# Patient Record
Sex: Female | Born: 1955
Health system: Southern US, Community
[De-identification: ages and names within clinical notes are randomized; demographics above are authoritative.]

## PROBLEM LIST (undated history)

## (undated) DIAGNOSIS — I4891 Unspecified atrial fibrillation: Secondary | ICD-10-CM

## (undated) DIAGNOSIS — K219 Gastro-esophageal reflux disease without esophagitis: Secondary | ICD-10-CM

## (undated) DIAGNOSIS — E079 Disorder of thyroid, unspecified: Secondary | ICD-10-CM

## (undated) DIAGNOSIS — L509 Urticaria, unspecified: Secondary | ICD-10-CM

## (undated) DIAGNOSIS — F419 Anxiety disorder, unspecified: Secondary | ICD-10-CM

## (undated) HISTORY — DX: Anxiety disorder, unspecified: F41.9

## (undated) HISTORY — DX: Urticaria, unspecified: L50.9

## (undated) HISTORY — DX: Unspecified atrial fibrillation: I48.91

## (undated) HISTORY — DX: Gastro-esophageal reflux disease without esophagitis: K21.9

## (undated) HISTORY — DX: Disorder of thyroid, unspecified: E07.9

## (undated) HISTORY — PX: TUMOR REMOVAL: SHX12

## (undated) HISTORY — PX: CATARACT EXTRACTION: SUR2

---

## 1998-02-11 ENCOUNTER — Other Ambulatory Visit: Admission: RE | Admit: 1998-02-11 | Discharge: 1998-02-11 | Payer: Self-pay | Admitting: Obstetrics & Gynecology

## 1999-04-22 ENCOUNTER — Other Ambulatory Visit: Admission: RE | Admit: 1999-04-22 | Discharge: 1999-04-22 | Payer: Self-pay | Admitting: Obstetrics & Gynecology

## 2000-07-11 ENCOUNTER — Other Ambulatory Visit: Admission: RE | Admit: 2000-07-11 | Discharge: 2000-07-11 | Payer: Self-pay | Admitting: Obstetrics & Gynecology

## 2001-11-01 ENCOUNTER — Other Ambulatory Visit: Admission: RE | Admit: 2001-11-01 | Discharge: 2001-11-01 | Payer: Self-pay | Admitting: Obstetrics & Gynecology

## 2002-11-14 ENCOUNTER — Other Ambulatory Visit: Admission: RE | Admit: 2002-11-14 | Discharge: 2002-11-14 | Payer: Self-pay | Admitting: Obstetrics & Gynecology

## 2003-11-27 ENCOUNTER — Other Ambulatory Visit: Admission: RE | Admit: 2003-11-27 | Discharge: 2003-11-27 | Payer: Self-pay | Admitting: Obstetrics & Gynecology

## 2004-11-20 ENCOUNTER — Other Ambulatory Visit: Admission: RE | Admit: 2004-11-20 | Discharge: 2004-11-20 | Payer: Self-pay | Admitting: Obstetrics & Gynecology

## 2005-12-08 ENCOUNTER — Other Ambulatory Visit: Admission: RE | Admit: 2005-12-08 | Discharge: 2005-12-08 | Payer: Self-pay | Admitting: Obstetrics & Gynecology

## 2015-07-17 DIAGNOSIS — L501 Idiopathic urticaria: Secondary | ICD-10-CM | POA: Insufficient documentation

## 2015-07-17 DIAGNOSIS — L309 Dermatitis, unspecified: Secondary | ICD-10-CM | POA: Insufficient documentation

## 2015-07-25 ENCOUNTER — Other Ambulatory Visit: Payer: Self-pay

## 2015-07-25 MED ORDER — OMALIZUMAB 150 MG ~~LOC~~ SOLR
300.0000 mg | SUBCUTANEOUS | Status: DC
  Administered 2015-08-14 – 2016-12-27 (×16): 300 mg via SUBCUTANEOUS

## 2015-08-14 ENCOUNTER — Ambulatory Visit (INDEPENDENT_AMBULATORY_CARE_PROVIDER_SITE_OTHER): Payer: Commercial Managed Care - PPO | Admitting: *Deleted

## 2015-08-14 DIAGNOSIS — L501 Idiopathic urticaria: Secondary | ICD-10-CM | POA: Diagnosis not present

## 2015-09-15 ENCOUNTER — Ambulatory Visit (INDEPENDENT_AMBULATORY_CARE_PROVIDER_SITE_OTHER): Payer: Commercial Managed Care - PPO

## 2015-09-15 DIAGNOSIS — L501 Idiopathic urticaria: Secondary | ICD-10-CM | POA: Diagnosis not present

## 2015-10-13 ENCOUNTER — Ambulatory Visit (INDEPENDENT_AMBULATORY_CARE_PROVIDER_SITE_OTHER): Payer: Commercial Managed Care - PPO

## 2015-10-13 DIAGNOSIS — L501 Idiopathic urticaria: Secondary | ICD-10-CM

## 2015-10-14 ENCOUNTER — Other Ambulatory Visit: Payer: Self-pay

## 2015-10-14 MED ORDER — DERMA-SMOOTHE/FS SCALP 0.01 % EX OIL: TOPICAL_OIL | CUTANEOUS | Status: DC

## 2015-10-15 ENCOUNTER — Other Ambulatory Visit: Payer: Self-pay

## 2015-10-15 MED ORDER — DERMA-SMOOTHE/FS SCALP 0.01 % EX OIL: TOPICAL_OIL | CUTANEOUS | Status: DC

## 2015-11-13 ENCOUNTER — Ambulatory Visit (INDEPENDENT_AMBULATORY_CARE_PROVIDER_SITE_OTHER): Payer: Commercial Managed Care - PPO | Admitting: *Deleted

## 2015-11-13 DIAGNOSIS — L501 Idiopathic urticaria: Secondary | ICD-10-CM | POA: Diagnosis not present

## 2015-12-17 ENCOUNTER — Ambulatory Visit (INDEPENDENT_AMBULATORY_CARE_PROVIDER_SITE_OTHER): Payer: Commercial Managed Care - PPO | Admitting: *Deleted

## 2015-12-17 DIAGNOSIS — L501 Idiopathic urticaria: Secondary | ICD-10-CM | POA: Diagnosis not present

## 2016-01-15 ENCOUNTER — Ambulatory Visit (INDEPENDENT_AMBULATORY_CARE_PROVIDER_SITE_OTHER): Payer: Commercial Managed Care - PPO | Admitting: *Deleted

## 2016-01-15 DIAGNOSIS — L501 Idiopathic urticaria: Secondary | ICD-10-CM | POA: Diagnosis not present

## 2016-02-02 ENCOUNTER — Ambulatory Visit (INDEPENDENT_AMBULATORY_CARE_PROVIDER_SITE_OTHER): Payer: Commercial Managed Care - PPO | Admitting: Allergy and Immunology

## 2016-02-02 ENCOUNTER — Encounter: Payer: Self-pay | Admitting: Allergy and Immunology

## 2016-02-02 VITALS — BP 110/70 | HR 64 | Resp 16 | Ht 63.5 in | Wt 113.5 lb

## 2016-02-02 DIAGNOSIS — L308 Other specified dermatitis: Secondary | ICD-10-CM | POA: Diagnosis not present

## 2016-02-02 DIAGNOSIS — L501 Idiopathic urticaria: Secondary | ICD-10-CM

## 2016-02-02 DIAGNOSIS — L989 Disorder of the skin and subcutaneous tissue, unspecified: Secondary | ICD-10-CM

## 2016-02-02 NOTE — Patient Instructions (Signed)
  1. Continue Xolair 300 mg monthly and EpiPen  2. Continue as needed use of over-the-counter antihistamine  3. Continued as needed use of Derma-Smoothe scalp oil one time per day  4. Return to clinic in 1 year or earlier if problem

## 2016-02-02 NOTE — Progress Notes (Signed)
Follow-up Note  Referring Provider: Gordan Raymond, Greg A., MD Primary Provider: Feliciana RossettiGRISSO,GREG, MD Date of Office Visit: 02/02/2016  Subjective:   Heidi JewsCynthia Raymond (DOB: 03/22/1956) is a 60 y.o. female [facility] in re-evaluation of the following:  HPI Comments: Heidi BeechamCynthia returns to this clinic in reevaluation of her idiopathic urticaria treated with Xolair and inflammatory dermatitis affecting her scalp. She is really done quite well since her last visit of August 2016. Towards the tail end of her Xolair administration she will develop a few days of her urticaria that can be treated with an over-the-counter antihistamine. She does not use any other medications on a consistent basis to treat this condition. She uses Derma-Smoothe scalp oil as needed usually around a frequency of once or twice a month which is working quite well to keep her scalp dermatitis under control.   Outpatient Prescriptions Prior to Visit  Medication Sig Dispense Refill  . ALPRAZOLAM PO Take by mouth as needed.    Marland Kitchen. aspirin EC 325 MG tablet Take 325 mg by mouth daily.    . cholecalciferol (VITAMIN D) 1000 UNITS tablet Take 1,000 Units by mouth daily.    . Fluocinolone Acetonide (DERMA-SMOOTHE/FS SCALP) 0.01 % OIL Apply to scalp and leave on overnight as directed 118 mL 2  . MAGNESIUM CARBONATE PO Take by mouth every other day.    . Omega-3 Fatty Acids (FISH OIL EXTRA STRENGTH) 1200 MG CAPS Take by mouth daily.    . cetirizine (ZYRTEC) 10 MG tablet Take 10 mg by mouth daily as needed for allergies. Reported on 02/02/2016    . montelukast (SINGULAIR) 10 MG tablet Take 10 mg by mouth at bedtime. Reported on 02/02/2016    . ranitidine (ZANTAC) 150 MG tablet Take 150 mg by mouth daily. Reported on 02/02/2016     Facility-Administered Medications Prior to Visit  Medication Dose Route Frequency Provider Last Rate Last Dose  . omalizumab Geoffry Paradise(XOLAIR) injection 300 mg  300 mg Subcutaneous  Q14 Days Heidi PriestEric Raymond , MD   300 mg at 01/15/16 1356    No past medical history on file.  Past Surgical History  Procedure Laterality Date  . Tumor removal      behind ear    Allergies  Allergen Reactions  . Nuprin [Ibuprofen] Other (See Comments)    FOREHEAD BREAKS OUT    Review of systems negative except as noted in HPI / PMHx or noted below:  Review of Systems  Constitutional: Negative.   HENT: Negative.   Eyes: Negative.   Respiratory: Negative.   Cardiovascular: Negative.   Gastrointestinal: Negative.   Genitourinary: Negative.   Musculoskeletal: Negative.   Skin: Negative.   Neurological: Negative.   Endo/Heme/Allergies: Negative.   Psychiatric/Behavioral: Negative.      Objective:   Filed Vitals:   02/02/16 1720  BP: 110/70  Pulse: 64  Resp: 16   Height: 5' 3.5" (161.3 cm)  Weight: 113 lb 8.6 oz (51.5 kg)   Physical Exam  Constitutional: She is well-developed, well-nourished, and in no distress.  HENT:  Head: Normocephalic.  Right Ear: Tympanic membrane, external ear and ear canal normal.  Left Ear: Tympanic membrane, external ear and ear canal normal.  Nose: Nose normal. No mucosal edema or rhinorrhea.  Mouth/Throat: Uvula is midline, oropharynx is clear and moist and mucous membranes are normal. No oropharyngeal exudate.  Eyes: Conjunctivae are normal.  Neck: Trachea normal. No tracheal tenderness present. No tracheal deviation present.  No thyromegaly present.  Cardiovascular: Normal rate, regular rhythm, S1 normal, S2 normal and normal heart sounds.   No murmur heard. Pulmonary/Chest: Breath sounds normal. No stridor. No respiratory distress. She has no wheezes. She has no rales.  Musculoskeletal: She exhibits no edema.  Lymphadenopathy:       Head (right side): No tonsillar adenopathy present.       Head (left side): No tonsillar adenopathy present.    She has no cervical adenopathy.    She has no axillary adenopathy.  Neurological: She is  alert. Gait normal.  Skin: No rash noted. She is not diaphoretic. No erythema. Nails show no clubbing.  Psychiatric: Mood and affect normal.    Diagnostics: None    Assessment and Plan:   1. Idiopathic urticaria   2. Inflammatory dermatosis     1. Continue Xolair 300 mg monthly and EpiPen  2. Continue as needed use of over-the-counter antihistamine  3. Continued as needed use of Derma-Smoothe scalp oil one time per day  4. Return to clinic in 1 year or earlier if problem  Heidi Raymond has done very well on her current medical therapy and I see no need for changing this treatment at this point in time. We'll see her back in this clinic in 1 year or earlier if there is a problem.  Heidi Schimke, MD [facility]

## 2016-02-03 ENCOUNTER — Encounter: Payer: Self-pay | Admitting: Allergy and Immunology

## 2016-02-18 ENCOUNTER — Ambulatory Visit (INDEPENDENT_AMBULATORY_CARE_PROVIDER_SITE_OTHER): Payer: Commercial Managed Care - PPO | Admitting: *Deleted

## 2016-02-18 DIAGNOSIS — L501 Idiopathic urticaria: Secondary | ICD-10-CM | POA: Diagnosis not present

## 2016-03-22 ENCOUNTER — Ambulatory Visit (INDEPENDENT_AMBULATORY_CARE_PROVIDER_SITE_OTHER): Payer: Commercial Managed Care - PPO | Admitting: *Deleted

## 2016-03-22 DIAGNOSIS — L501 Idiopathic urticaria: Secondary | ICD-10-CM | POA: Diagnosis not present

## 2016-04-19 ENCOUNTER — Ambulatory Visit (INDEPENDENT_AMBULATORY_CARE_PROVIDER_SITE_OTHER): Payer: Commercial Managed Care - PPO | Admitting: *Deleted

## 2016-04-19 DIAGNOSIS — L501 Idiopathic urticaria: Secondary | ICD-10-CM | POA: Diagnosis not present

## 2016-05-20 ENCOUNTER — Ambulatory Visit (INDEPENDENT_AMBULATORY_CARE_PROVIDER_SITE_OTHER): Payer: Commercial Managed Care - PPO | Admitting: *Deleted

## 2016-05-20 DIAGNOSIS — L501 Idiopathic urticaria: Secondary | ICD-10-CM | POA: Diagnosis not present

## 2016-06-21 ENCOUNTER — Ambulatory Visit (INDEPENDENT_AMBULATORY_CARE_PROVIDER_SITE_OTHER): Payer: Commercial Managed Care - PPO | Admitting: *Deleted

## 2016-06-21 DIAGNOSIS — L501 Idiopathic urticaria: Secondary | ICD-10-CM

## 2016-07-26 ENCOUNTER — Ambulatory Visit (INDEPENDENT_AMBULATORY_CARE_PROVIDER_SITE_OTHER): Payer: Commercial Managed Care - PPO | Admitting: *Deleted

## 2016-07-26 DIAGNOSIS — L501 Idiopathic urticaria: Secondary | ICD-10-CM

## 2016-08-30 ENCOUNTER — Ambulatory Visit (INDEPENDENT_AMBULATORY_CARE_PROVIDER_SITE_OTHER): Payer: Commercial Managed Care - PPO | Admitting: *Deleted

## 2016-08-30 DIAGNOSIS — L501 Idiopathic urticaria: Secondary | ICD-10-CM

## 2016-09-01 ENCOUNTER — Other Ambulatory Visit: Payer: Self-pay | Admitting: Allergy and Immunology

## 2016-09-27 ENCOUNTER — Ambulatory Visit (INDEPENDENT_AMBULATORY_CARE_PROVIDER_SITE_OTHER): Payer: Commercial Managed Care - PPO | Admitting: *Deleted

## 2016-09-27 DIAGNOSIS — L501 Idiopathic urticaria: Secondary | ICD-10-CM | POA: Diagnosis not present

## 2016-10-25 ENCOUNTER — Ambulatory Visit (INDEPENDENT_AMBULATORY_CARE_PROVIDER_SITE_OTHER): Payer: Commercial Managed Care - PPO | Admitting: *Deleted

## 2016-10-25 DIAGNOSIS — L501 Idiopathic urticaria: Secondary | ICD-10-CM

## 2016-11-24 ENCOUNTER — Ambulatory Visit (INDEPENDENT_AMBULATORY_CARE_PROVIDER_SITE_OTHER): Payer: Commercial Managed Care - PPO | Admitting: *Deleted

## 2016-11-24 DIAGNOSIS — L501 Idiopathic urticaria: Secondary | ICD-10-CM

## 2016-12-27 ENCOUNTER — Ambulatory Visit (INDEPENDENT_AMBULATORY_CARE_PROVIDER_SITE_OTHER): Payer: Commercial Managed Care - PPO | Admitting: *Deleted

## 2016-12-27 DIAGNOSIS — L501 Idiopathic urticaria: Secondary | ICD-10-CM

## 2017-01-17 ENCOUNTER — Ambulatory Visit: Payer: Commercial Managed Care - PPO | Admitting: Allergy and Immunology

## 2017-01-24 ENCOUNTER — Ambulatory Visit: Payer: Self-pay | Admitting: Allergy and Immunology

## 2017-02-28 ENCOUNTER — Ambulatory Visit (INDEPENDENT_AMBULATORY_CARE_PROVIDER_SITE_OTHER): Payer: Commercial Managed Care - PPO | Admitting: Allergy and Immunology

## 2017-02-28 ENCOUNTER — Encounter: Payer: Self-pay | Admitting: Allergy and Immunology

## 2017-02-28 VITALS — BP 122/84 | HR 60 | Resp 16

## 2017-02-28 DIAGNOSIS — L501 Idiopathic urticaria: Secondary | ICD-10-CM | POA: Diagnosis not present

## 2017-02-28 DIAGNOSIS — L308 Other specified dermatitis: Secondary | ICD-10-CM

## 2017-02-28 DIAGNOSIS — L989 Disorder of the skin and subcutaneous tissue, unspecified: Secondary | ICD-10-CM

## 2017-02-28 NOTE — Patient Instructions (Signed)
  1. Decrease Xolair 150 mg monthly and EpiPen  2. Continue as needed use of over-the-counter antihistamine  3. Continued as needed use of Derma-Smoothe scalp oil one time per day  4. Return to clinic in 1 year or earlier if problem

## 2017-02-28 NOTE — Progress Notes (Signed)
Follow-up Note  Referring Provider: Gordan Payment., MD Primary Provider: Feliciana Rossetti, MD Date of Office Visit: 02/28/2017  Subjective:   Heidi Raymond (DOB: 31-Dec-1955) is a 61 y.o. female who [facility]2017 in re-evaluation of the following:  HPI: Heidi Raymond returns to this clinic in reevaluation of her idiopathic urticaria and inflammatory dermatitis affecting her scalp. I have not seen her in this clinic in 1 year.  While utilizing omalizumab 300 mg once a month she has had complete control of her urticaria. She no longer uses any over-the-counter antihistamine. Rarely does she have any problems with the inflammatory condition of her scalp. She rarely uses any Derma-Smoothe scalp oil at this point.  Allergies as of 02/28/2017      Reactions   Nuprin [ibuprofen] Other (See Comments)   FOREHEAD BREAKS OUT      Medication List      ALPRAZOLAM PO Take by mouth as needed.   aspirin EC 325 MG tablet Take 325 mg by mouth daily.   cetirizine 10 MG tablet Commonly known as:  ZYRTEC Take 10 mg by mouth daily as needed for allergies. Reported on 02/02/2016   cholecalciferol 1000 units tablet Commonly known as:  VITAMIN D Take 1,000 Units by mouth daily.   DERMA-SMOOTHE/FS SCALP 0.01 % Oil Generic drug:  Fluocinolone Acetonide Scalp Apply to scalp and leave on overnight as directed   FISH OIL EXTRA STRENGTH 1200 MG Caps Take by mouth daily.   Lutein 10 MG Tabs Take 10 mg by mouth daily.   MAGNESIUM CARBONATE PO Take by mouth every other day.   vitamin C 500 MG tablet Commonly known as:  ASCORBIC ACID Take 500 mg by mouth daily.   XOLAIR 150 MG injection Generic drug:  omalizumab MIX AND INJECT 300 MG UNDER THE SKIN EVERY 4 WEEKS       History reviewed. No pertinent past medical history.  Past Surgical History:  Procedure Laterality Date  . TUMOR REMOVAL     behind ear    Review of systems negative except as noted  in HPI / PMHx or noted below:  Review of Systems  Constitutional: Negative.   HENT: Negative.   Eyes: Negative.   Respiratory: Negative.   Cardiovascular: Negative.   Gastrointestinal: Negative.   Genitourinary: Negative.   Musculoskeletal: Negative.   Skin: Negative.   Neurological: Negative.   Endo/Heme/Allergies: Negative.   Psychiatric/Behavioral: Negative.      Objective:   Vitals:   02/28/17 1727  BP: 122/84  Pulse: 60  Resp: 16          Physical Exam  Constitutional: She is well-developed, well-nourished, and in no distress.  HENT:  Head: Normocephalic.  Right Ear: Tympanic membrane, external ear and ear canal normal.  Left Ear: Tympanic membrane, external ear and ear canal normal.  Nose: Nose normal. No mucosal edema or rhinorrhea.  Mouth/Throat: Uvula is midline, oropharynx is clear and moist and mucous membranes are normal. No oropharyngeal exudate.  Eyes: Conjunctivae are normal.  Neck: Trachea normal. No tracheal tenderness present. No tracheal deviation present. No thyromegaly present.  Cardiovascular: Normal rate, regular rhythm, S1 normal, S2 normal and normal heart sounds.   No murmur heard. Pulmonary/Chest: Breath sounds normal. No stridor. No respiratory distress. She has no wheezes. She has no rales.  Musculoskeletal: She exhibits no edema.  Lymphadenopathy:       Head (right side): No tonsillar adenopathy present.  Head (left side): No tonsillar adenopathy present.    She has no cervical adenopathy.  Neurological: She is alert. Gait normal.  Skin: No rash noted. She is not diaphoretic. No erythema. Nails show no clubbing.  Psychiatric: Mood and affect normal.    Diagnostics: none   Assessment and Plan:   1. Idiopathic urticaria   2. Inflammatory dermatosis     1. Decrease Xolair 150 mg monthly and EpiPen  2. Continue as needed use of over-the-counter antihistamine  3. Continued as needed use of Derma-Smoothe scalp oil one time  per day  4. Return to clinic in 1 year or earlier if problem  We will now see if we can lower the dose of omalizumab that is required to control [name]'s immunologic hyperreactivity manifested as chronic urticaria and apparent inflammatory dermatosis involving her scalp. She will keep in contact with me noting her response to this approach. If she does well I will see her in this clinic in one year or earlier if there is a problem.  [name], MD Allergy / Immunology [facility]

## 2017-03-02 ENCOUNTER — Other Ambulatory Visit: Payer: Self-pay | Admitting: *Deleted

## 2017-03-02 MED ORDER — OMALIZUMAB 150 MG ~~LOC~~ SOLR
150.0000 mg | SUBCUTANEOUS | Status: DC
  Administered 2017-03-07 – 2018-02-09 (×10): 150 mg via SUBCUTANEOUS

## 2017-03-07 ENCOUNTER — Ambulatory Visit (INDEPENDENT_AMBULATORY_CARE_PROVIDER_SITE_OTHER): Payer: Commercial Managed Care - PPO | Admitting: *Deleted

## 2017-03-07 DIAGNOSIS — L501 Idiopathic urticaria: Secondary | ICD-10-CM

## 2017-03-09 ENCOUNTER — Other Ambulatory Visit: Payer: Self-pay | Admitting: Obstetrics & Gynecology

## 2017-03-09 DIAGNOSIS — E2839 Other primary ovarian failure: Secondary | ICD-10-CM

## 2017-03-23 ENCOUNTER — Ambulatory Visit
Admission: RE | Admit: 2017-03-23 | Discharge: 2017-03-23 | Disposition: A | Discharge status: Home / Self Care | Payer: Commercial Managed Care - PPO | Source: Ambulatory Visit | Attending: Obstetrics & Gynecology | Admitting: Obstetrics & Gynecology

## 2017-03-23 DIAGNOSIS — E2839 Other primary ovarian failure: Secondary | ICD-10-CM

## 2017-04-18 ENCOUNTER — Ambulatory Visit (INDEPENDENT_AMBULATORY_CARE_PROVIDER_SITE_OTHER): Payer: Commercial Managed Care - PPO | Admitting: *Deleted

## 2017-04-18 DIAGNOSIS — L501 Idiopathic urticaria: Secondary | ICD-10-CM | POA: Diagnosis not present

## 2017-04-22 ENCOUNTER — Telehealth: Payer: Self-pay | Admitting: *Deleted

## 2017-04-22 NOTE — Telephone Encounter (Signed)
Patient needs a call back about her account

## 2017-04-22 NOTE — Telephone Encounter (Signed)
Pt has an NSF check on her account but she called her bank & they say they never rec'd that check from us - emailed Amy J to help with this, but have not heard from her yet - left patient a message that I am off Mon-Wed and will get back with her on Thursday.

## 2017-04-28 NOTE — Telephone Encounter (Signed)
Called pt back today but she is out until Monday - left message to call me back - kt

## 2017-04-28 NOTE — Telephone Encounter (Signed)
Pt called me back - gave me add'l account number - emailed Orlena SheldonKeyonna & Amy to see if these numbers would help - kt

## 2017-04-29 NOTE — Telephone Encounter (Signed)
Called pt - we scanned her check improperly so all the numbers did not show up & bank was not able to process - I have adjusted the NSF fee - she will send another check - kt

## 2017-05-16 ENCOUNTER — Ambulatory Visit (INDEPENDENT_AMBULATORY_CARE_PROVIDER_SITE_OTHER): Payer: Commercial Managed Care - PPO | Admitting: *Deleted

## 2017-05-16 DIAGNOSIS — L501 Idiopathic urticaria: Secondary | ICD-10-CM | POA: Diagnosis not present

## 2017-06-20 ENCOUNTER — Ambulatory Visit (INDEPENDENT_AMBULATORY_CARE_PROVIDER_SITE_OTHER): Payer: Commercial Managed Care - PPO | Admitting: *Deleted

## 2017-06-20 DIAGNOSIS — L501 Idiopathic urticaria: Secondary | ICD-10-CM | POA: Diagnosis not present

## 2017-07-25 ENCOUNTER — Ambulatory Visit (INDEPENDENT_AMBULATORY_CARE_PROVIDER_SITE_OTHER): Payer: Commercial Managed Care - PPO | Admitting: *Deleted

## 2017-07-25 DIAGNOSIS — L501 Idiopathic urticaria: Secondary | ICD-10-CM

## 2017-08-01 ENCOUNTER — Other Ambulatory Visit: Payer: Self-pay | Admitting: Allergy and Immunology

## 2017-09-19 ENCOUNTER — Ambulatory Visit (INDEPENDENT_AMBULATORY_CARE_PROVIDER_SITE_OTHER): Payer: Commercial Managed Care - PPO | Admitting: *Deleted

## 2017-09-19 DIAGNOSIS — L501 Idiopathic urticaria: Secondary | ICD-10-CM

## 2017-11-10 ENCOUNTER — Ambulatory Visit (INDEPENDENT_AMBULATORY_CARE_PROVIDER_SITE_OTHER): Payer: Commercial Managed Care - PPO | Admitting: *Deleted

## 2017-11-10 DIAGNOSIS — L501 Idiopathic urticaria: Secondary | ICD-10-CM

## 2017-12-15 ENCOUNTER — Ambulatory Visit (INDEPENDENT_AMBULATORY_CARE_PROVIDER_SITE_OTHER): Payer: Commercial Managed Care - PPO | Admitting: *Deleted

## 2017-12-15 DIAGNOSIS — L501 Idiopathic urticaria: Secondary | ICD-10-CM | POA: Diagnosis not present

## 2018-01-12 ENCOUNTER — Ambulatory Visit (INDEPENDENT_AMBULATORY_CARE_PROVIDER_SITE_OTHER): Payer: Commercial Managed Care - PPO | Admitting: *Deleted

## 2018-01-12 DIAGNOSIS — L501 Idiopathic urticaria: Secondary | ICD-10-CM | POA: Diagnosis not present

## 2018-02-09 ENCOUNTER — Ambulatory Visit (INDEPENDENT_AMBULATORY_CARE_PROVIDER_SITE_OTHER): Payer: Commercial Managed Care - PPO | Admitting: *Deleted

## 2018-02-09 DIAGNOSIS — L501 Idiopathic urticaria: Secondary | ICD-10-CM

## 2018-02-27 ENCOUNTER — Encounter: Payer: Self-pay | Admitting: Allergy and Immunology

## 2018-02-27 ENCOUNTER — Ambulatory Visit: Payer: Commercial Managed Care - PPO | Admitting: Allergy and Immunology

## 2018-02-27 VITALS — BP 126/82 | HR 64 | Resp 16

## 2018-02-27 DIAGNOSIS — L501 Idiopathic urticaria: Secondary | ICD-10-CM | POA: Diagnosis not present

## 2018-02-27 DIAGNOSIS — L989 Disorder of the skin and subcutaneous tissue, unspecified: Secondary | ICD-10-CM

## 2018-02-27 DIAGNOSIS — L308 Other specified dermatitis: Secondary | ICD-10-CM

## 2018-02-27 MED ORDER — TRIAMCINOLONE ACETONIDE 0.1 % EX CREA: TOPICAL_CREAM | CUTANEOUS | 5 refills | Status: DC

## 2018-02-27 NOTE — Progress Notes (Signed)
Follow-up Note  Referring Provider: Gordan Payment., MD Primary Provider: Gordan Payment., MD Date of Office Visit: 02/27/2018  Subjective:   Heidi Raymond (DOB: 1956-09-13) is a 62 y.o. female who [facility]/2018 in re-evaluation of the following:  HPI: Heidi Raymond returns to this clinic in reevaluation of her idiopathic urticaria and inflammatory dermatosis involving predominantly scalp.  I have not seen her in this clinic since 28 February 2017.  At that point in time we decreased her omalizumab dose from 300 mg monthly to 150 mg monthly.  She did very well with her omalizumab injections and rarely used any over-the-counter antihistamine and all of the itchiness involving her scalp had resolved and she had no need to use Derma-Smoothe scalp oil.  However, for the past 3 months or so she has developed recurrent red patches that are intensely itchy involving her posterior neck and her left back.  She has been applying an old prescription of Kenalog cream which may help somewhat.  There has not really been a significant environmental change in her life over the course of the past 3-6 months and there is not been any associated systemic or constitutional symptoms that may account for her change in immunological activity.  Allergies as of 02/27/2018      Reactions   Nuprin [ibuprofen] Other (See Comments)   FOREHEAD BREAKS OUT      Medication List      ALPRAZOLAM PO Take by mouth as needed.   aspirin EC 325 MG tablet Take 325 mg by mouth daily.   cholecalciferol 1000 units tablet Commonly known as:  VITAMIN D Take 1,000 Units by mouth daily.   DERMA-SMOOTHE/FS SCALP 0.01 % Oil Generic drug:  Fluocinolone Acetonide Scalp Apply to scalp and leave on overnight as directed   FISH OIL EXTRA STRENGTH 1200 MG Caps Take by mouth daily.   Lutein 10 MG Tabs Take 10 mg by mouth daily.   MAGNESIUM CARBONATE PO Take by mouth every other day.     vitamin C 500 MG tablet Commonly known as:  ASCORBIC ACID Take 500 mg by mouth daily.   XOLAIR 150 MG injection Generic drug:  omalizumab Inject 150 mg into the skin every 28 (twenty-eight) days. New updated dosage       Past Medical History:  Diagnosis Date  . Urticaria     Past Surgical History:  Procedure Laterality Date  . TUMOR REMOVAL     behind ear    Review of systems negative except as noted in HPI / PMHx or noted below:  Review of Systems  Constitutional: Negative.   HENT: Negative.   Eyes: Negative.   Respiratory: Negative.   Cardiovascular: Negative.   Gastrointestinal: Negative.   Genitourinary: Negative.   Musculoskeletal: Negative.   Skin: Negative.   Neurological: Negative.   Endo/Heme/Allergies: Negative.   Psychiatric/Behavioral: Negative.      Objective:   Vitals:   02/27/18 1730  BP: 126/82  Pulse: 64  Resp: 16          Physical Exam  HENT:  Head: Normocephalic.  Right Ear: Tympanic membrane, external ear and ear canal normal.  Left Ear: Tympanic membrane, external ear and ear canal normal.  Nose: Nose normal. No mucosal edema or rhinorrhea.  Mouth/Throat: Uvula is midline, oropharynx is clear and moist and mucous membranes are normal. No oropharyngeal exudate.  Eyes: Conjunctivae are normal.  Neck: Trachea normal. No tracheal tenderness present. No  tracheal deviation present. No thyromegaly present.  Cardiovascular: Normal rate, regular rhythm, S1 normal, S2 normal and normal heart sounds.  No murmur heard. Pulmonary/Chest: Breath sounds normal. No stridor. No respiratory distress. She has no wheezes. She has no rales.  Musculoskeletal: She exhibits no edema.  Lymphadenopathy:       Head (right side): No tonsillar adenopathy present.       Head (left side): No tonsillar adenopathy present.    She has no cervical adenopathy.  Neurological: She is alert.  Skin: Rash (Erythematous indurated blanching lesion left upper back)  noted. She is not diaphoretic. No erythema. Nails show no clubbing.    Diagnostics: none  Assessment and Plan:   1. Idiopathic urticaria   2. Inflammatory dermatosis     1. Increase Xolair 300 mg monthly and EpiPen  2. Continue as needed use of over-the-counter antihistamine  3. Continued as needed use of Derma-Smoothe scalp oil one time per day  4.  Can use triamcinolone 0.1% cream applied 1-2 times per day if needed  5. Return to clinic in 1 year or earlier if problem  Heidi Raymond has had a little bit more immunological hyperreactivity lately and we will increase her omalizumab back up to 300 mg and she has the option of utilizing other forms of therapy directed against an inflammatory dermatosis.  I will see her back in this clinic pending her response.  If she does well I will see her back in this clinic in 1 year or earlier if there is a problem.  Laurette SchimkeEric Kozlow, MD Allergy / Immunology [facility]

## 2018-02-27 NOTE — Patient Instructions (Addendum)
  1. Increase Xolair 300 mg monthly and EpiPen  2. Continue as needed use of over-the-counter antihistamine  3. Continued as needed use of Derma-Smoothe scalp oil one time per day  4.  Can use triamcinolone 0.1% cream applied 1-2 times per day if needed  5. Return to clinic in 1 year or earlier if problem

## 2018-02-28 ENCOUNTER — Encounter: Payer: Self-pay | Admitting: Allergy and Immunology

## 2018-03-09 ENCOUNTER — Ambulatory Visit: Payer: Self-pay

## 2018-03-13 ENCOUNTER — Ambulatory Visit (INDEPENDENT_AMBULATORY_CARE_PROVIDER_SITE_OTHER): Payer: Commercial Managed Care - PPO | Admitting: *Deleted

## 2018-03-13 DIAGNOSIS — L501 Idiopathic urticaria: Secondary | ICD-10-CM | POA: Diagnosis not present

## 2018-03-13 MED ORDER — OMALIZUMAB 150 MG ~~LOC~~ SOLR
300.0000 mg | SUBCUTANEOUS | Status: DC
  Administered 2018-03-13 – 2020-12-08 (×35): 300 mg via SUBCUTANEOUS

## 2018-03-27 ENCOUNTER — Other Ambulatory Visit: Payer: Self-pay | Admitting: *Deleted

## 2018-03-27 ENCOUNTER — Ambulatory Visit: Payer: Self-pay | Admitting: *Deleted

## 2018-03-27 MED ORDER — XOLAIR 150 MG ~~LOC~~ SOLR: 300.0000 mg | SUBCUTANEOUS | 11 refills | Status: DC

## 2018-03-28 DIAGNOSIS — Z01419 Encounter for gynecological examination (general) (routine) without abnormal findings: Secondary | ICD-10-CM | POA: Diagnosis not present

## 2018-03-28 DIAGNOSIS — Z1231 Encounter for screening mammogram for malignant neoplasm of breast: Secondary | ICD-10-CM | POA: Diagnosis not present

## 2018-03-28 DIAGNOSIS — Z0000 Encounter for general adult medical examination without abnormal findings: Secondary | ICD-10-CM | POA: Diagnosis not present

## 2018-03-28 DIAGNOSIS — Z124 Encounter for screening for malignant neoplasm of cervix: Secondary | ICD-10-CM | POA: Diagnosis not present

## 2018-04-04 DIAGNOSIS — R002 Palpitations: Secondary | ICD-10-CM | POA: Diagnosis not present

## 2018-04-04 DIAGNOSIS — E039 Hypothyroidism, unspecified: Secondary | ICD-10-CM | POA: Diagnosis not present

## 2018-04-05 DIAGNOSIS — R001 Bradycardia, unspecified: Secondary | ICD-10-CM | POA: Diagnosis not present

## 2018-04-11 ENCOUNTER — Ambulatory Visit: Payer: Self-pay

## 2018-04-12 ENCOUNTER — Ambulatory Visit (INDEPENDENT_AMBULATORY_CARE_PROVIDER_SITE_OTHER): Payer: Commercial Managed Care - PPO | Admitting: *Deleted

## 2018-04-12 ENCOUNTER — Ambulatory Visit: Payer: Self-pay

## 2018-04-12 DIAGNOSIS — L501 Idiopathic urticaria: Secondary | ICD-10-CM | POA: Diagnosis not present

## 2018-04-13 ENCOUNTER — Ambulatory Visit: Payer: Self-pay

## 2018-04-25 DIAGNOSIS — E039 Hypothyroidism, unspecified: Secondary | ICD-10-CM | POA: Diagnosis not present

## 2018-05-10 ENCOUNTER — Ambulatory Visit: Payer: Self-pay

## 2018-05-15 ENCOUNTER — Ambulatory Visit (INDEPENDENT_AMBULATORY_CARE_PROVIDER_SITE_OTHER): Payer: Commercial Managed Care - PPO | Admitting: *Deleted

## 2018-05-15 DIAGNOSIS — L501 Idiopathic urticaria: Secondary | ICD-10-CM

## 2018-06-12 ENCOUNTER — Ambulatory Visit (INDEPENDENT_AMBULATORY_CARE_PROVIDER_SITE_OTHER): Payer: Commercial Managed Care - PPO | Admitting: *Deleted

## 2018-06-12 DIAGNOSIS — L501 Idiopathic urticaria: Secondary | ICD-10-CM

## 2018-07-10 ENCOUNTER — Ambulatory Visit (INDEPENDENT_AMBULATORY_CARE_PROVIDER_SITE_OTHER): Payer: Commercial Managed Care - PPO | Admitting: *Deleted

## 2018-07-10 DIAGNOSIS — L501 Idiopathic urticaria: Secondary | ICD-10-CM | POA: Diagnosis not present

## 2018-08-07 ENCOUNTER — Ambulatory Visit (INDEPENDENT_AMBULATORY_CARE_PROVIDER_SITE_OTHER): Payer: Commercial Managed Care - PPO | Admitting: *Deleted

## 2018-08-07 DIAGNOSIS — L501 Idiopathic urticaria: Secondary | ICD-10-CM | POA: Diagnosis not present

## 2018-09-04 ENCOUNTER — Ambulatory Visit (INDEPENDENT_AMBULATORY_CARE_PROVIDER_SITE_OTHER): Payer: Commercial Managed Care - PPO | Admitting: *Deleted

## 2018-09-04 DIAGNOSIS — L501 Idiopathic urticaria: Secondary | ICD-10-CM

## 2018-10-02 ENCOUNTER — Ambulatory Visit (INDEPENDENT_AMBULATORY_CARE_PROVIDER_SITE_OTHER): Payer: Commercial Managed Care - PPO | Admitting: *Deleted

## 2018-10-02 DIAGNOSIS — L501 Idiopathic urticaria: Secondary | ICD-10-CM

## 2018-10-30 ENCOUNTER — Ambulatory Visit (INDEPENDENT_AMBULATORY_CARE_PROVIDER_SITE_OTHER): Payer: Commercial Managed Care - PPO | Admitting: *Deleted

## 2018-10-30 DIAGNOSIS — L501 Idiopathic urticaria: Secondary | ICD-10-CM | POA: Diagnosis not present

## 2018-11-27 ENCOUNTER — Ambulatory Visit (INDEPENDENT_AMBULATORY_CARE_PROVIDER_SITE_OTHER): Payer: Commercial Managed Care - PPO | Admitting: *Deleted

## 2018-11-27 DIAGNOSIS — L501 Idiopathic urticaria: Secondary | ICD-10-CM

## 2018-12-25 ENCOUNTER — Ambulatory Visit: Payer: Self-pay

## 2018-12-27 DIAGNOSIS — J040 Acute laryngitis: Secondary | ICD-10-CM | POA: Diagnosis not present

## 2018-12-27 DIAGNOSIS — J309 Allergic rhinitis, unspecified: Secondary | ICD-10-CM | POA: Diagnosis not present

## 2019-01-01 ENCOUNTER — Ambulatory Visit (INDEPENDENT_AMBULATORY_CARE_PROVIDER_SITE_OTHER): Payer: Commercial Managed Care - PPO | Admitting: *Deleted

## 2019-01-01 DIAGNOSIS — L501 Idiopathic urticaria: Secondary | ICD-10-CM | POA: Diagnosis not present

## 2019-01-29 ENCOUNTER — Ambulatory Visit (INDEPENDENT_AMBULATORY_CARE_PROVIDER_SITE_OTHER): Payer: Commercial Managed Care - PPO | Admitting: *Deleted

## 2019-01-29 ENCOUNTER — Other Ambulatory Visit: Payer: Self-pay

## 2019-01-29 DIAGNOSIS — L501 Idiopathic urticaria: Secondary | ICD-10-CM | POA: Diagnosis not present

## 2019-02-26 ENCOUNTER — Ambulatory Visit (INDEPENDENT_AMBULATORY_CARE_PROVIDER_SITE_OTHER): Payer: Commercial Managed Care - PPO | Admitting: *Deleted

## 2019-02-26 ENCOUNTER — Other Ambulatory Visit: Payer: Self-pay

## 2019-02-26 DIAGNOSIS — L501 Idiopathic urticaria: Secondary | ICD-10-CM | POA: Diagnosis not present

## 2019-02-28 ENCOUNTER — Other Ambulatory Visit: Payer: Self-pay | Admitting: Allergy and Immunology

## 2019-02-28 MED ORDER — STERILE WATER FOR INJECTION IJ SOLN: 10.0000 mL | INTRAMUSCULAR | 11 refills | Status: DC

## 2019-02-28 NOTE — Telephone Encounter (Signed)
I am sure you have seen this already, but FYI!  Thanks!

## 2019-03-26 ENCOUNTER — Other Ambulatory Visit: Payer: Self-pay

## 2019-03-26 ENCOUNTER — Ambulatory Visit (INDEPENDENT_AMBULATORY_CARE_PROVIDER_SITE_OTHER): Payer: Commercial Managed Care - PPO | Admitting: *Deleted

## 2019-03-26 DIAGNOSIS — L501 Idiopathic urticaria: Secondary | ICD-10-CM | POA: Diagnosis not present

## 2019-04-23 ENCOUNTER — Ambulatory Visit (INDEPENDENT_AMBULATORY_CARE_PROVIDER_SITE_OTHER): Payer: Commercial Managed Care - PPO | Admitting: *Deleted

## 2019-04-23 ENCOUNTER — Other Ambulatory Visit: Payer: Self-pay

## 2019-04-23 DIAGNOSIS — L501 Idiopathic urticaria: Secondary | ICD-10-CM | POA: Diagnosis not present

## 2019-04-30 ENCOUNTER — Other Ambulatory Visit: Payer: Self-pay

## 2019-04-30 ENCOUNTER — Encounter: Payer: Self-pay | Admitting: Allergy and Immunology

## 2019-04-30 ENCOUNTER — Ambulatory Visit: Payer: Commercial Managed Care - PPO | Admitting: Allergy and Immunology

## 2019-04-30 VITALS — BP 116/64 | HR 64 | Temp 97.3°F | Resp 16

## 2019-04-30 DIAGNOSIS — L308 Other specified dermatitis: Secondary | ICD-10-CM | POA: Diagnosis not present

## 2019-04-30 DIAGNOSIS — L989 Disorder of the skin and subcutaneous tissue, unspecified: Secondary | ICD-10-CM

## 2019-04-30 DIAGNOSIS — L501 Idiopathic urticaria: Secondary | ICD-10-CM | POA: Diagnosis not present

## 2019-04-30 MED ORDER — FLUOCINOLONE ACETONIDE SCALP 0.01 % EX OIL: TOPICAL_OIL | CUTANEOUS | 11 refills | Status: DC

## 2019-04-30 NOTE — Progress Notes (Signed)
[place]   Follow-up Note  Referring Provider: Gordan PaymentGrisso, Greg A., MD Primary Provider: Gordan PaymentGrisso, Greg A., MD Date of Office Visit: 04/30/2019  Subjective:   Heidi JewsCynthia Raymond (DOB: 08/12/1956) is a 63 y.o. female who [facility] 04[facility]/2019 in re-evaluation of the following:  HPI: Heidi BeechamCynthia returns to this clinic in reevaluation of idiopathic urticaria and inflammatory dermatosis involving her scalp.  I last saw her in this clinic 27 February 2018.  She is really doing well with her skin issue.  Rarely does she have any urticaria.  She still continues to have intermittent breakouts of inflammatory dermatosis that take about 2 weeks of topical steroid treatment to resolve.  Usually they are very small patch about lemon-sized or so.  They never heal with scar or hyperpigmentation.  She occasionally uses some Derma-Smoothe scalp oil when her scalp gets red or itchy.  She does continue to use Xolair on a consistent basis without any adverse effect.  Allergies as of 04/30/2019      Reactions   Nuprin [ibuprofen] Other (See Comments)   FOREHEAD BREAKS OUT      Medication List      ALPRAZOLAM PO Take by mouth as needed.   aspirin EC 325 MG tablet Take 325 mg by mouth daily.   cholecalciferol 1000 units tablet Commonly known as: VITAMIN D Take 1,000 Units by mouth daily.   Derma-Smoothe/FS Scalp 0.01 % Oil Generic drug: Fluocinolone Acetonide Scalp Apply to scalp and leave on overnight as directed   Fish Oil Extra Strength 1200 MG Caps Take by mouth daily.   Lutein 10 MG Tabs Take 10 mg by mouth daily.   MAGNESIUM CARBONATE PO Take by mouth every other day.   sterile water (preservative free) injection Inject 10 mLs as directed as directed.   triamcinolone cream 0.1 % Commonly known as: KENALOG Apply to affected areas once or twice daily if needed   vitamin C 500 MG tablet Commonly known as:  ASCORBIC ACID Take 500 mg by mouth daily.   Xolair 150 MG injection Generic drug: omalizumab INJECT 300 MG UNDER THE SKIN EVERY 28 DAYS       Past Medical History:  Diagnosis Date  . Urticaria     Past Surgical History:  Procedure Laterality Date  . TUMOR REMOVAL     behind ear    Review of systems negative except as noted in HPI / PMHx or noted below:  Review of Systems  Constitutional: Negative.   HENT: Negative.   Eyes: Negative.   Respiratory: Negative.   Cardiovascular: Negative.   Gastrointestinal: Negative.   Genitourinary: Negative.   Musculoskeletal: Negative.   Skin: Negative.   Neurological: Negative.   Endo/Heme/Allergies: Negative.   Psychiatric/Behavioral: Negative.      Objective:   Vitals:   04/30/19 1737  BP: 116/64  Pulse: 64  Resp: 16  Temp: (!) 97.3 F (36.3 C)  SpO2: 99%          Physical Exam Constitutional:      Appearance: She is not diaphoretic.  HENT:     Head: Normocephalic.     Right Ear: Tympanic membrane, ear canal and external ear normal.     Left Ear: Tympanic membrane, ear canal and external ear normal.     Nose: Nose normal. No mucosal edema or rhinorrhea.     Mouth/Throat:     Pharynx: Uvula midline. No oropharyngeal exudate.  Eyes:     Conjunctiva/sclera: Conjunctivae normal.  Neck:     Thyroid: No thyromegaly.     Trachea: Trachea normal. No tracheal tenderness or tracheal deviation.  Cardiovascular:     Rate and Rhythm: Normal rate and regular rhythm.     Heart sounds: Normal heart sounds, S1 normal and S2 normal. No murmur.  Pulmonary:     Effort: No respiratory distress.     Breath sounds: Normal breath sounds. No stridor. No wheezing or rales.  Lymphadenopathy:     Head:     Right side of head: No tonsillar adenopathy.     Left side of head: No tonsillar adenopathy.     Cervical: No cervical adenopathy.  Skin:    Findings: Rash (patch of red, thickened skin right lateral neck) present. No  erythema.     Nails: There is no clubbing.   Neurological:     Mental Status: She is alert.     Diagnostics: none  Assessment and Plan:   1. Idiopathic urticaria   2. Inflammatory dermatosis     1. Continue Xolair 300 mg monthly and EpiPen  2. Continue as needed use of over-the-counter antihistamine  3. Continue as needed use of Derma-Smoothe scalp oil one time per day  4.  Continue triamcinolone 0.1% cream applied 1-2 times per day if needed  5. Return to clinic in 1 year or earlier if problem  6. Obtain fall flu vaccine  Heidi Raymond appears to be doing relatively well on her plan of therapy which includes omalizumab and topical anti-inflammatory agents utilized on occasion.  She will continue to utilize this plan and I will see her back in this clinic in 1 year or earlier if there is a problem.  Allena Katz, MD Allergy / Immunology [place]

## 2019-04-30 NOTE — Patient Instructions (Addendum)
  1. Continue Xolair 300 mg monthly and EpiPen  2. Continue as needed use of over-the-counter antihistamine  3. Continue as needed use of Derma-Smoothe scalp oil one time per day  4.  Continue triamcinolone 0.1% cream applied 1-2 times per day if needed  5. Return to clinic in 1 year or earlier if problem  6. Obtain fall flu vaccine

## 2019-05-01 ENCOUNTER — Encounter: Payer: Self-pay | Admitting: Allergy and Immunology

## 2019-05-01 ENCOUNTER — Other Ambulatory Visit: Payer: Self-pay

## 2019-05-01 MED ORDER — FLUOCINOLONE ACETONIDE SCALP 0.01 % EX OIL: TOPICAL_OIL | CUTANEOUS | 5 refills | Status: DC

## 2019-05-21 ENCOUNTER — Other Ambulatory Visit: Payer: Self-pay | Admitting: *Deleted

## 2019-05-21 ENCOUNTER — Other Ambulatory Visit: Payer: Self-pay

## 2019-05-21 ENCOUNTER — Ambulatory Visit (INDEPENDENT_AMBULATORY_CARE_PROVIDER_SITE_OTHER): Payer: Commercial Managed Care - PPO | Admitting: *Deleted

## 2019-05-21 DIAGNOSIS — L501 Idiopathic urticaria: Secondary | ICD-10-CM

## 2019-05-21 MED ORDER — TRIAMCINOLONE ACETONIDE 0.1 % EX CREA: TOPICAL_CREAM | CUTANEOUS | 10 refills | Status: DC

## 2019-06-18 ENCOUNTER — Ambulatory Visit: Payer: Self-pay

## 2019-06-25 ENCOUNTER — Ambulatory Visit (INDEPENDENT_AMBULATORY_CARE_PROVIDER_SITE_OTHER): Payer: Commercial Managed Care - PPO | Admitting: *Deleted

## 2019-06-25 ENCOUNTER — Other Ambulatory Visit: Payer: Self-pay

## 2019-06-25 DIAGNOSIS — L501 Idiopathic urticaria: Secondary | ICD-10-CM | POA: Diagnosis not present

## 2019-07-24 ENCOUNTER — Ambulatory Visit (INDEPENDENT_AMBULATORY_CARE_PROVIDER_SITE_OTHER): Payer: Commercial Managed Care - PPO | Admitting: *Deleted

## 2019-07-24 ENCOUNTER — Other Ambulatory Visit: Payer: Self-pay

## 2019-07-24 DIAGNOSIS — L501 Idiopathic urticaria: Secondary | ICD-10-CM

## 2019-08-20 ENCOUNTER — Other Ambulatory Visit: Payer: Self-pay

## 2019-08-20 ENCOUNTER — Ambulatory Visit (INDEPENDENT_AMBULATORY_CARE_PROVIDER_SITE_OTHER): Payer: Commercial Managed Care - PPO | Admitting: *Deleted

## 2019-08-20 DIAGNOSIS — L501 Idiopathic urticaria: Secondary | ICD-10-CM | POA: Diagnosis not present

## 2019-09-17 ENCOUNTER — Ambulatory Visit (INDEPENDENT_AMBULATORY_CARE_PROVIDER_SITE_OTHER): Payer: Commercial Managed Care - PPO | Admitting: *Deleted

## 2019-09-17 ENCOUNTER — Other Ambulatory Visit: Payer: Self-pay

## 2019-09-17 DIAGNOSIS — L501 Idiopathic urticaria: Secondary | ICD-10-CM

## 2019-10-15 ENCOUNTER — Ambulatory Visit (INDEPENDENT_AMBULATORY_CARE_PROVIDER_SITE_OTHER): Payer: Commercial Managed Care - PPO | Admitting: *Deleted

## 2019-10-15 ENCOUNTER — Other Ambulatory Visit: Payer: Self-pay

## 2019-10-15 DIAGNOSIS — L501 Idiopathic urticaria: Secondary | ICD-10-CM | POA: Diagnosis not present

## 2019-11-12 ENCOUNTER — Other Ambulatory Visit: Payer: Self-pay

## 2019-11-12 ENCOUNTER — Ambulatory Visit (INDEPENDENT_AMBULATORY_CARE_PROVIDER_SITE_OTHER): Payer: Commercial Managed Care - PPO | Admitting: *Deleted

## 2019-11-12 DIAGNOSIS — L501 Idiopathic urticaria: Secondary | ICD-10-CM

## 2019-12-10 ENCOUNTER — Ambulatory Visit: Payer: Self-pay

## 2019-12-17 ENCOUNTER — Ambulatory Visit (INDEPENDENT_AMBULATORY_CARE_PROVIDER_SITE_OTHER): Payer: Commercial Managed Care - PPO | Admitting: *Deleted

## 2019-12-17 DIAGNOSIS — L501 Idiopathic urticaria: Secondary | ICD-10-CM

## 2020-01-14 ENCOUNTER — Ambulatory Visit (INDEPENDENT_AMBULATORY_CARE_PROVIDER_SITE_OTHER): Payer: Commercial Managed Care - PPO | Admitting: *Deleted

## 2020-01-14 ENCOUNTER — Other Ambulatory Visit: Payer: Self-pay

## 2020-01-14 DIAGNOSIS — L501 Idiopathic urticaria: Secondary | ICD-10-CM | POA: Diagnosis not present

## 2020-01-28 ENCOUNTER — Other Ambulatory Visit: Payer: Self-pay | Admitting: Allergy and Immunology

## 2020-02-11 ENCOUNTER — Other Ambulatory Visit: Payer: Self-pay

## 2020-02-11 ENCOUNTER — Ambulatory Visit (INDEPENDENT_AMBULATORY_CARE_PROVIDER_SITE_OTHER): Payer: Commercial Managed Care - PPO | Admitting: *Deleted

## 2020-02-11 DIAGNOSIS — L501 Idiopathic urticaria: Secondary | ICD-10-CM | POA: Diagnosis not present

## 2020-03-10 ENCOUNTER — Other Ambulatory Visit: Payer: Self-pay

## 2020-03-10 ENCOUNTER — Ambulatory Visit (INDEPENDENT_AMBULATORY_CARE_PROVIDER_SITE_OTHER): Payer: Commercial Managed Care - PPO | Admitting: *Deleted

## 2020-03-10 DIAGNOSIS — L501 Idiopathic urticaria: Secondary | ICD-10-CM | POA: Diagnosis not present

## 2020-04-07 ENCOUNTER — Ambulatory Visit (INDEPENDENT_AMBULATORY_CARE_PROVIDER_SITE_OTHER): Payer: Commercial Managed Care - PPO | Admitting: Allergy and Immunology

## 2020-04-07 DIAGNOSIS — L501 Idiopathic urticaria: Secondary | ICD-10-CM

## 2020-05-20 ENCOUNTER — Other Ambulatory Visit: Payer: Self-pay

## 2020-05-20 ENCOUNTER — Ambulatory Visit (INDEPENDENT_AMBULATORY_CARE_PROVIDER_SITE_OTHER): Payer: Commercial Managed Care - PPO | Admitting: *Deleted

## 2020-05-20 DIAGNOSIS — L501 Idiopathic urticaria: Secondary | ICD-10-CM

## 2020-06-16 ENCOUNTER — Ambulatory Visit (INDEPENDENT_AMBULATORY_CARE_PROVIDER_SITE_OTHER): Payer: Commercial Managed Care - PPO | Admitting: *Deleted

## 2020-06-16 ENCOUNTER — Other Ambulatory Visit: Payer: Self-pay

## 2020-06-16 DIAGNOSIS — L501 Idiopathic urticaria: Secondary | ICD-10-CM

## 2020-07-14 ENCOUNTER — Other Ambulatory Visit: Payer: Self-pay

## 2020-07-14 ENCOUNTER — Ambulatory Visit (INDEPENDENT_AMBULATORY_CARE_PROVIDER_SITE_OTHER): Payer: Commercial Managed Care - PPO | Admitting: *Deleted

## 2020-07-14 DIAGNOSIS — L501 Idiopathic urticaria: Secondary | ICD-10-CM

## 2020-08-11 ENCOUNTER — Ambulatory Visit (INDEPENDENT_AMBULATORY_CARE_PROVIDER_SITE_OTHER): Payer: Commercial Managed Care - PPO

## 2020-08-11 ENCOUNTER — Other Ambulatory Visit: Payer: Self-pay

## 2020-08-11 DIAGNOSIS — L501 Idiopathic urticaria: Secondary | ICD-10-CM

## 2020-09-08 ENCOUNTER — Other Ambulatory Visit: Payer: Self-pay

## 2020-09-08 ENCOUNTER — Ambulatory Visit (INDEPENDENT_AMBULATORY_CARE_PROVIDER_SITE_OTHER): Payer: Commercial Managed Care - PPO | Admitting: *Deleted

## 2020-09-08 DIAGNOSIS — L501 Idiopathic urticaria: Secondary | ICD-10-CM | POA: Diagnosis not present

## 2020-10-06 ENCOUNTER — Other Ambulatory Visit: Payer: Self-pay

## 2020-10-06 ENCOUNTER — Ambulatory Visit (INDEPENDENT_AMBULATORY_CARE_PROVIDER_SITE_OTHER): Payer: Commercial Managed Care - PPO

## 2020-10-06 DIAGNOSIS — L501 Idiopathic urticaria: Secondary | ICD-10-CM | POA: Diagnosis not present

## 2020-11-03 ENCOUNTER — Ambulatory Visit (INDEPENDENT_AMBULATORY_CARE_PROVIDER_SITE_OTHER): Payer: Commercial Managed Care - PPO

## 2020-11-03 ENCOUNTER — Other Ambulatory Visit: Payer: Self-pay

## 2020-11-03 DIAGNOSIS — L501 Idiopathic urticaria: Secondary | ICD-10-CM | POA: Diagnosis not present

## 2020-12-01 ENCOUNTER — Ambulatory Visit: Payer: Self-pay

## 2020-12-08 ENCOUNTER — Ambulatory Visit (INDEPENDENT_AMBULATORY_CARE_PROVIDER_SITE_OTHER): Payer: Managed Care, Other (non HMO)

## 2020-12-08 DIAGNOSIS — L501 Idiopathic urticaria: Secondary | ICD-10-CM

## 2021-01-05 ENCOUNTER — Other Ambulatory Visit: Payer: Self-pay

## 2021-01-05 ENCOUNTER — Ambulatory Visit (INDEPENDENT_AMBULATORY_CARE_PROVIDER_SITE_OTHER): Payer: Managed Care, Other (non HMO) | Admitting: *Deleted

## 2021-01-05 DIAGNOSIS — L501 Idiopathic urticaria: Secondary | ICD-10-CM | POA: Diagnosis not present

## 2021-01-05 MED ORDER — OMALIZUMAB 150 MG/ML ~~LOC~~ SOSY
300.0000 mg | PREFILLED_SYRINGE | SUBCUTANEOUS | Status: DC
  Administered 2021-01-05 – 2023-05-26 (×25): 300 mg via SUBCUTANEOUS

## 2021-02-02 ENCOUNTER — Ambulatory Visit (INDEPENDENT_AMBULATORY_CARE_PROVIDER_SITE_OTHER): Payer: Managed Care, Other (non HMO) | Admitting: *Deleted

## 2021-02-02 ENCOUNTER — Other Ambulatory Visit: Payer: Self-pay

## 2021-02-02 DIAGNOSIS — L501 Idiopathic urticaria: Secondary | ICD-10-CM

## 2021-03-02 ENCOUNTER — Ambulatory Visit: Payer: Managed Care, Other (non HMO)

## 2021-03-09 ENCOUNTER — Ambulatory Visit (INDEPENDENT_AMBULATORY_CARE_PROVIDER_SITE_OTHER): Payer: Managed Care, Other (non HMO) | Admitting: *Deleted

## 2021-03-09 DIAGNOSIS — L501 Idiopathic urticaria: Secondary | ICD-10-CM | POA: Diagnosis not present

## 2021-04-06 ENCOUNTER — Ambulatory Visit (INDEPENDENT_AMBULATORY_CARE_PROVIDER_SITE_OTHER): Payer: Managed Care, Other (non HMO)

## 2021-04-06 DIAGNOSIS — L501 Idiopathic urticaria: Secondary | ICD-10-CM | POA: Diagnosis not present

## 2021-05-04 ENCOUNTER — Ambulatory Visit (INDEPENDENT_AMBULATORY_CARE_PROVIDER_SITE_OTHER): Payer: Managed Care, Other (non HMO) | Admitting: *Deleted

## 2021-05-04 ENCOUNTER — Other Ambulatory Visit: Payer: Self-pay

## 2021-05-04 DIAGNOSIS — L501 Idiopathic urticaria: Secondary | ICD-10-CM | POA: Diagnosis not present

## 2021-06-01 ENCOUNTER — Other Ambulatory Visit: Payer: Self-pay

## 2021-06-01 ENCOUNTER — Ambulatory Visit (INDEPENDENT_AMBULATORY_CARE_PROVIDER_SITE_OTHER): Payer: Managed Care, Other (non HMO) | Admitting: *Deleted

## 2021-06-01 DIAGNOSIS — L501 Idiopathic urticaria: Secondary | ICD-10-CM

## 2021-06-11 ENCOUNTER — Telehealth: Payer: Self-pay | Admitting: *Deleted

## 2021-06-11 NOTE — Telephone Encounter (Signed)
L/m for patient to contact me. With her New Ins she will need to go through [facility] for Xolair but will need MD appt for Ins approval (last was 2 yrs ago)

## 2021-06-15 NOTE — Telephone Encounter (Signed)
Advised patient MD appt needed to get approval and submit with new Ins for Xolair

## 2021-06-18 ENCOUNTER — Encounter: Payer: Self-pay | Admitting: Allergy and Immunology

## 2021-06-18 ENCOUNTER — Other Ambulatory Visit: Payer: Self-pay

## 2021-06-18 ENCOUNTER — Ambulatory Visit: Payer: Managed Care, Other (non HMO) | Admitting: Allergy and Immunology

## 2021-06-18 VITALS — BP 124/82 | HR 64 | Resp 16 | Ht 63.0 in | Wt 117.6 lb

## 2021-06-18 DIAGNOSIS — L501 Idiopathic urticaria: Secondary | ICD-10-CM

## 2021-06-18 DIAGNOSIS — L989 Disorder of the skin and subcutaneous tissue, unspecified: Secondary | ICD-10-CM | POA: Diagnosis not present

## 2021-06-18 NOTE — Patient Instructions (Signed)
  1. Continue Xolair 300 mg monthly and EpiPen   2. Continue as needed use of over-the-counter antihistamine   3. Continue as needed use of Derma-Smoothe scalp oil one time per day   4.  Continue as needed use of triamcinolone 0.1% cream applied 1-2 times per day    5. Return to clinic in 1 year or earlier if problem   6. Obtain fall flu vaccine

## 2021-06-18 NOTE — Progress Notes (Signed)
[place] - [place] - [place] - [place] - [place]   Follow-up Note  Referring Provider: Gordan Payment., MD Primary Provider: Gordan Payment., MD Date of Office Visit: 06/18/2021  Subjective:   Heidi Raymond (DOB: 10-Aug-1956) is a 65 y.o. female who [facility] in re-evaluation of the following:  HPI: Heidi Raymond presents to this clinic in evaluation of her urticaria and inflammatory dermatosis involving her scalp.  Her last visit to this clinic with me was 30 April 2019.  She has been receiving omalizumab injections every month and has had an excellent response regarding control of her urticaria.  She still occasionally and rarely gets a flareup on her back of an inflammatory dermatosis and rarely and occasionally gets a flareup on her scalp.  She will use a topical triamcinolone about 1 time per month and a Derma-Smoothe scalp oil with a similar frequency.  Overall she is very pleased with the response that she has received with this treatment.  She has received 2 Pfizer COVID vaccines.  She informs me that she has been having some problems with palpitations and she is seeing a local cardiologist who is given her diltiazem to be used as needed.  She does have a lot of frequent palpitations and she definitely has them with a frequency that exceeds twice a week or so and they do seem to bother her.  Allergies as of 06/18/2021       Reactions   Nuprin [ibuprofen] Other (See Comments)   FOREHEAD BREAKS OUT        Medication List    ALPRAZOLAM PO Take by mouth as needed.   aspirin EC 325 MG tablet Take 325 mg by mouth daily.   cholecalciferol 1000 units tablet Commonly known as: VITAMIN D Take 1,000 Units by mouth daily.   Fish Oil Extra Strength 1200 MG Caps Take by mouth daily.   Fluocinolone Acetonide Scalp 0.01 % Oil Apply to scalp and leave on overnight as directed.   Lutein 10 MG Tabs Take 10 mg by mouth daily.    MAGNESIUM CARBONATE PO Take by mouth every other day.   sterile water (preservative free) injection Inject 10 mLs as directed as directed.   triamcinolone cream 0.1 % Commonly known as: KENALOG Apply to affected areas once or twice daily if needed   vitamin C 500 MG tablet Commonly known as: ASCORBIC ACID Take 500 mg by mouth daily.   Xolair 150 MG injection Generic drug: omalizumab INJECT 300 MG UNDER THE SKIN EVERY 28 DAYS        Past Medical History:  Diagnosis Date   Urticaria     Past Surgical History:  Procedure Laterality Date   TUMOR REMOVAL     behind ear    Review of systems negative except as noted in HPI / PMHx or noted below:  Review of Systems  Constitutional: Negative.   HENT: Negative.    Eyes: Negative.   Respiratory: Negative.    Cardiovascular: Negative.   Gastrointestinal: Negative.   Genitourinary: Negative.   Musculoskeletal: Negative.   Skin: Negative.   Neurological: Negative.   Endo/Heme/Allergies: Negative.   Psychiatric/Behavioral: Negative.      Objective:   Vitals:   06/18/21 1142  BP: 124/82  Pulse: 64  Resp: 16  SpO2: 97%   Height: 5\' 3"  (160 cm)  Weight: 117 lb 9.6 oz (53.3 kg)   Physical Exam Constitutional:      Appearance: She  is not diaphoretic.  HENT:     Head: Normocephalic.     Right Ear: Tympanic membrane, ear canal and external ear normal.     Left Ear: Tympanic membrane, ear canal and external ear normal.     Nose: Nose normal. No mucosal edema or rhinorrhea.     Mouth/Throat:     Pharynx: Uvula midline. No oropharyngeal exudate.  Eyes:     Conjunctiva/sclera: Conjunctivae normal.  Neck:     Thyroid: No thyromegaly.     Trachea: Trachea normal. No tracheal tenderness or tracheal deviation.  Cardiovascular:     Rate and Rhythm: Normal rate and regular rhythm.     Heart sounds: Normal heart sounds, S1 normal and S2 normal. No murmur heard. Pulmonary:     Effort: No respiratory distress.      Breath sounds: Normal breath sounds. No stridor. No wheezing or rales.  Lymphadenopathy:     Head:     Right side of head: No tonsillar adenopathy.     Left side of head: No tonsillar adenopathy.     Cervical: No cervical adenopathy.  Skin:    Findings: No erythema or rash.     Nails: There is no clubbing.  Neurological:     Mental Status: She is alert.    Diagnostics: none  Assessment and Plan:   1. Idiopathic urticaria   2. Inflammatory dermatosis     1. Continue Xolair 300 mg monthly and EpiPen   2. Continue as needed use of over-the-counter antihistamine   3. Continue as needed use of Derma-Smoothe scalp oil one time per day   4.  Continue as needed use of triamcinolone 0.1% cream applied 1-2 times per day    5. Return to clinic in 1 year or earlier if problem   6. Obtain fall flu vaccine  Heidi Raymond has really done well on her current plan of omalizumab regarding control for chronic urticaria.  I did inform her that she could attempt to stretch out the interval of use to every 6 weeks and then every 8 weeks if she continues to do well.  She can use her topical anti-inflammatory agents as needed for her intermittent inflammatory dermatosis.  We will see her back in this clinic in 1 year or earlier if there is a problem.  [name], MD Allergy / Immunology [facility]

## 2021-06-22 ENCOUNTER — Encounter: Payer: Self-pay | Admitting: Allergy and Immunology

## 2021-06-29 ENCOUNTER — Ambulatory Visit: Payer: Managed Care, Other (non HMO)

## 2021-07-06 ENCOUNTER — Ambulatory Visit (INDEPENDENT_AMBULATORY_CARE_PROVIDER_SITE_OTHER): Payer: Managed Care, Other (non HMO) | Admitting: *Deleted

## 2021-07-06 DIAGNOSIS — L501 Idiopathic urticaria: Secondary | ICD-10-CM | POA: Diagnosis not present

## 2021-07-09 ENCOUNTER — Ambulatory Visit: Payer: Managed Care, Other (non HMO) | Admitting: Allergy and Immunology

## 2021-08-17 ENCOUNTER — Other Ambulatory Visit: Payer: Self-pay

## 2021-08-17 ENCOUNTER — Ambulatory Visit (INDEPENDENT_AMBULATORY_CARE_PROVIDER_SITE_OTHER): Payer: Managed Care, Other (non HMO) | Admitting: *Deleted

## 2021-08-17 DIAGNOSIS — J4540 Moderate persistent asthma, uncomplicated: Secondary | ICD-10-CM | POA: Diagnosis not present

## 2021-08-17 DIAGNOSIS — L501 Idiopathic urticaria: Secondary | ICD-10-CM

## 2021-09-14 ENCOUNTER — Ambulatory Visit: Payer: Managed Care, Other (non HMO)

## 2021-09-28 ENCOUNTER — Ambulatory Visit: Payer: Managed Care, Other (non HMO)

## 2021-10-05 ENCOUNTER — Other Ambulatory Visit: Payer: Self-pay

## 2021-10-05 ENCOUNTER — Ambulatory Visit (INDEPENDENT_AMBULATORY_CARE_PROVIDER_SITE_OTHER): Payer: Managed Care, Other (non HMO)

## 2021-10-05 DIAGNOSIS — L501 Idiopathic urticaria: Secondary | ICD-10-CM

## 2021-10-05 DIAGNOSIS — J4540 Moderate persistent asthma, uncomplicated: Secondary | ICD-10-CM | POA: Diagnosis not present

## 2021-11-20 DIAGNOSIS — I361 Nonrheumatic tricuspid (valve) insufficiency: Secondary | ICD-10-CM

## 2021-11-23 ENCOUNTER — Other Ambulatory Visit: Payer: Self-pay

## 2021-11-23 ENCOUNTER — Ambulatory Visit (INDEPENDENT_AMBULATORY_CARE_PROVIDER_SITE_OTHER): Payer: Managed Care, Other (non HMO) | Admitting: *Deleted

## 2021-11-23 DIAGNOSIS — L501 Idiopathic urticaria: Secondary | ICD-10-CM | POA: Diagnosis not present

## 2022-01-04 ENCOUNTER — Ambulatory Visit (INDEPENDENT_AMBULATORY_CARE_PROVIDER_SITE_OTHER): Payer: Managed Care, Other (non HMO) | Admitting: *Deleted

## 2022-01-04 ENCOUNTER — Other Ambulatory Visit: Payer: Self-pay

## 2022-01-04 DIAGNOSIS — L501 Idiopathic urticaria: Secondary | ICD-10-CM | POA: Diagnosis not present

## 2022-01-11 ENCOUNTER — Ambulatory Visit: Payer: Managed Care, Other (non HMO) | Admitting: Allergy and Immunology

## 2022-01-11 ENCOUNTER — Other Ambulatory Visit: Payer: Self-pay

## 2022-01-11 ENCOUNTER — Encounter: Payer: Self-pay | Admitting: Allergy and Immunology

## 2022-01-11 VITALS — BP 118/62 | HR 60 | Resp 20 | Ht 62.8 in | Wt 119.4 lb

## 2022-01-11 DIAGNOSIS — L501 Idiopathic urticaria: Secondary | ICD-10-CM

## 2022-01-11 DIAGNOSIS — L989 Disorder of the skin and subcutaneous tissue, unspecified: Secondary | ICD-10-CM | POA: Diagnosis not present

## 2022-01-11 MED ORDER — TRIAMCINOLONE ACETONIDE 0.1 % EX CREA: TOPICAL_CREAM | CUTANEOUS | 10 refills | Status: DC

## 2022-01-11 NOTE — Progress Notes (Signed)
[place] - [place] - [place] - [place] - [place]   Follow-up Note  Referring Provider: Gordan Raymond., MD Primary Provider: Gordan Raymond., MD Date of Office Visit: 01/11/2022  Subjective:   Heidi Raymond Raymond (DOB: 11-12-1956) is a 66 y.o. female who [facility] 01[facility]11/2022 in re-evaluation of the following:  HPI: Heidi Raymond Raymond returns to this clinic in evaluation of her urticaria and inflammatory dermatosis.  Her last visit to this clinic was 18 June 2021.  She has had no urticaria.  She has had very little inflammatory dermatosis requiring her to use Derma-Smoothe or triamcinolone.  She continues on Xolair currently at 300 mg every 6 weeks.  She was diagnosed with atrial fibrillation January 2023 and is now on anticoagulation and daily diltiazem and she is currently in sinus rhythm.  Allergies as of 01/11/2022       Reactions   Nuprin [ibuprofen] Other (See Comments)   FOREHEAD BREAKS OUT        Medication List    ALPRAZOLAM PO Take by mouth as needed.   B COMPLEX PO Take by mouth daily.   CALCIUM PO Take by mouth daily.   cholecalciferol 1000 units tablet Commonly known as: VITAMIN D Take 1,000 Units by mouth daily.   diltiazem 120 MG 24 hr capsule Commonly known as: CARDIZEM CD Take 120 mg by mouth daily.   Eliquis 5 MG Tabs tablet Generic drug: apixaban Take 5 mg by mouth 2 (two) times daily.   Estradiol 10 MCG Tabs vaginal tablet Place vaginally.   Fluocinolone Acetonide Scalp 0.01 % Oil Apply to scalp and leave on overnight as directed.   levothyroxine 25 MCG tablet Commonly known as: SYNTHROID Take 25 mcg by mouth daily.   Lutein 10 MG Tabs Take 10 mg by mouth daily.   MAGNESIUM CARBONATE PO Take by mouth every other day.   sterile water (preservative free) injection Inject 10 mLs as directed as directed.   triamcinolone cream 0.1 % Commonly known as: KENALOG Apply to affected areas once or twice  daily if needed   vitamin C 500 MG tablet Commonly known as: ASCORBIC ACID Take 500 mg by mouth daily.   Xolair 150 MG injection Generic drug: omalizumab INJECT 300 MG UNDER THE SKIN EVERY 28 DAYS    Past Medical History:  Diagnosis Date   Urticaria     Past Surgical History:  Procedure Laterality Date   TUMOR REMOVAL     behind ear    Review of systems negative except as noted in HPI / PMHx or noted below:  Review of Systems  Constitutional: Negative.   HENT: Negative.    Eyes: Negative.   Respiratory: Negative.    Cardiovascular: Negative.   Gastrointestinal: Negative.   Genitourinary: Negative.   Musculoskeletal: Negative.   Skin: Negative.   Neurological: Negative.   Endo/Heme/Allergies: Negative.   Psychiatric/Behavioral: Negative.      Objective:   Vitals:   01/11/22 1737  BP: 118/62  Pulse: 60  Resp: 20  SpO2: 97%   Height: 5' 2.8" (159.5 cm)  Weight: 119 lb 6.4 oz (54.2 kg)   Physical Exam Constitutional:      Appearance: She is not diaphoretic.  HENT:     Head: Normocephalic.     Right Ear: Tympanic membrane, ear canal and external ear normal.     Left Ear: Tympanic membrane, ear canal and external ear normal.     Nose: Nose normal. No mucosal edema  or rhinorrhea.     Mouth/Throat:     Pharynx: Uvula midline. No oropharyngeal exudate.  Eyes:     Conjunctiva/sclera: Conjunctivae normal.  Neck:     Thyroid: No thyromegaly.     Trachea: Trachea normal. No tracheal tenderness or tracheal deviation.  Cardiovascular:     Rate and Rhythm: Normal rate and regular rhythm.     Heart sounds: Normal heart sounds, S1 normal and S2 normal. No murmur heard. Pulmonary:     Effort: No respiratory distress.     Breath sounds: Normal breath sounds. No stridor. No wheezing or rales.  Lymphadenopathy:     Head:     Right side of head: No tonsillar adenopathy.     Left side of head: No tonsillar adenopathy.     Cervical: No cervical adenopathy.  Skin:     Findings: No erythema or rash.     Nails: There is no clubbing.  Neurological:     Mental Status: She is alert.    Diagnostics: none  Assessment and Plan:   1. Idiopathic urticaria   2. Inflammatory dermatosis     1. Continue Xolair 300 mg monthly and EpiPen   2. Continue as needed use of over-the-counter antihistamine   3. Continue as needed use of Derma-Smoothe scalp oil one time per day   4.  Continue as needed use of triamcinolone 0.1% cream applied 1-2 times per day    5.  Consider discontinuing Xolair, increasing interval of Xolair administration every 8 weeks, decreasing Xolair monthly dosage.    6. Return to clinic in 1 year or earlier if problem    Heidi Raymond Raymond appears to be doing very well and she has been doing well now for a prolonged period in time and I think there is an opportunity to have her alter her Xolair administration.  She has 3 options.  She can discontinue this agent, increase the interval of administration, or decrease the dosage.  She is presently considering all of these options and she will contact this clinic when she has made a decision about how to consolidate her Xolair administration.  I will see her back in this clinic in 1 year or earlier if there is a problem.  Heidi Raymond Schimke, MD Allergy / Immunology [facility]

## 2022-01-11 NOTE — Patient Instructions (Addendum)
°  1. Continue Xolair 300 mg monthly and EpiPen   2. Continue as needed use of over-the-counter antihistamine   3. Continue as needed use of Derma-Smoothe scalp oil one time per day   4.  Continue as needed use of triamcinolone 0.1% cream applied 1-2 times per day    5.  Consider discontinuing Xolair, increasing interval of Xolair administration every 8 weeks, decreasing Xolair monthly dosage.    6. Return to clinic in 1 year or earlier if problem

## 2022-01-12 ENCOUNTER — Encounter: Payer: Self-pay | Admitting: Allergy and Immunology

## 2022-01-14 ENCOUNTER — Telehealth: Payer: Self-pay | Admitting: Allergy and Immunology

## 2022-01-14 NOTE — Telephone Encounter (Signed)
Patient wanted to let Dr. Lucie Leather know that she would like to decrease her Xolair monthly dosage to just one injection (150mg ) and she will continue her usual regimen.  ?

## 2022-01-14 NOTE — Telephone Encounter (Signed)
Please advise 

## 2022-02-01 ENCOUNTER — Ambulatory Visit: Payer: Managed Care, Other (non HMO)

## 2022-02-15 ENCOUNTER — Ambulatory Visit (INDEPENDENT_AMBULATORY_CARE_PROVIDER_SITE_OTHER): Payer: Managed Care, Other (non HMO) | Admitting: *Deleted

## 2022-02-15 DIAGNOSIS — L501 Idiopathic urticaria: Secondary | ICD-10-CM

## 2022-03-29 ENCOUNTER — Ambulatory Visit: Payer: Managed Care, Other (non HMO)

## 2022-05-13 ENCOUNTER — Ambulatory Visit (INDEPENDENT_AMBULATORY_CARE_PROVIDER_SITE_OTHER): Payer: Medicare Other | Admitting: *Deleted

## 2022-05-13 DIAGNOSIS — L501 Idiopathic urticaria: Secondary | ICD-10-CM

## 2022-06-17 DIAGNOSIS — D696 Thrombocytopenia, unspecified: Secondary | ICD-10-CM | POA: Insufficient documentation

## 2022-06-17 DIAGNOSIS — D72819 Decreased white blood cell count, unspecified: Secondary | ICD-10-CM | POA: Insufficient documentation

## 2022-06-24 ENCOUNTER — Ambulatory Visit (INDEPENDENT_AMBULATORY_CARE_PROVIDER_SITE_OTHER): Payer: Medicare Other | Admitting: *Deleted

## 2022-06-24 DIAGNOSIS — L501 Idiopathic urticaria: Secondary | ICD-10-CM

## 2022-07-15 ENCOUNTER — Other Ambulatory Visit: Payer: Self-pay | Admitting: Oncology

## 2022-07-15 DIAGNOSIS — D72819 Decreased white blood cell count, unspecified: Secondary | ICD-10-CM

## 2022-07-15 DIAGNOSIS — D696 Thrombocytopenia, unspecified: Secondary | ICD-10-CM

## 2022-07-16 ENCOUNTER — Inpatient Hospital Stay: Payer: Medicare Other

## 2022-07-16 ENCOUNTER — Encounter: Payer: Self-pay | Admitting: Oncology

## 2022-07-16 ENCOUNTER — Inpatient Hospital Stay: Payer: Medicare Other | Attending: Oncology | Admitting: Oncology

## 2022-07-16 ENCOUNTER — Other Ambulatory Visit: Payer: Self-pay | Admitting: Oncology

## 2022-07-16 VITALS — BP 109/56 | HR 72 | Temp 97.4°F | Resp 17 | Ht 63.5 in | Wt 118.1 lb

## 2022-07-16 DIAGNOSIS — D696 Thrombocytopenia, unspecified: Secondary | ICD-10-CM

## 2022-07-16 DIAGNOSIS — Z79899 Other long term (current) drug therapy: Secondary | ICD-10-CM | POA: Insufficient documentation

## 2022-07-16 DIAGNOSIS — Z87891 Personal history of nicotine dependence: Secondary | ICD-10-CM | POA: Diagnosis not present

## 2022-07-16 DIAGNOSIS — D72819 Decreased white blood cell count, unspecified: Secondary | ICD-10-CM

## 2022-07-16 DIAGNOSIS — D61818 Other pancytopenia: Secondary | ICD-10-CM | POA: Insufficient documentation

## 2022-07-16 DIAGNOSIS — Z7901 Long term (current) use of anticoagulants: Secondary | ICD-10-CM | POA: Diagnosis not present

## 2022-07-16 DIAGNOSIS — Z86718 Personal history of other venous thrombosis and embolism: Secondary | ICD-10-CM | POA: Diagnosis not present

## 2022-07-16 LAB — HEPATIC FUNCTION PANEL
ALT: 36 U/L — AB (ref 7–35)
AST: 39 — AB (ref 13–35)
Alkaline Phosphatase: 94 (ref 25–125)
Bilirubin, Total: 0.3

## 2022-07-16 LAB — BASIC METABOLIC PANEL
BUN: 20 (ref 4–21)
CO2: 35 — AB (ref 13–22)
Chloride: 102 (ref 99–108)
Creatinine: 0.8 (ref 0.5–1.1)
Glucose: 97
Potassium: 4.3 mEq/L (ref 3.5–5.1)
Sodium: 140 (ref 137–147)

## 2022-07-16 LAB — CBC: RBC: 4.07 (ref 3.87–5.11)

## 2022-07-16 LAB — CBC AND DIFFERENTIAL
HCT: 37 (ref 36–46)
Hemoglobin: 12.4 (ref 12.0–16.0)
Neutrophils Absolute: 2.42
Platelets: 139 10*3/uL — AB (ref 150–400)
WBC: 3.9

## 2022-07-16 LAB — COMPREHENSIVE METABOLIC PANEL
Albumin: 4.3 (ref 3.5–5.0)
Calcium: 9.6 (ref 8.7–10.7)

## 2022-07-16 LAB — VITAMIN B12: Vitamin B-12: 852 pg/mL (ref 180–914)

## 2022-07-16 LAB — FOLATE: Folate: >40 ng/mL (ref >5.9)

## 2022-07-21 LAB — PROTEIN ELECTROPHORESIS, SERUM
A/G Ratio: 1.4 (ref 0.7–1.7)
Albumin ELP: 3.7 g/dL (ref 2.9–4.4)
Alpha-1-Globulin: 0.2 g/dL (ref 0.0–0.4)
Alpha-2-Globulin: 0.6 g/dL (ref 0.4–1.0)
Beta Globulin: 0.8 g/dL (ref 0.7–1.3)
Gamma Globulin: 1 g/dL (ref 0.4–1.8)
Globulin, Total: 2.6 g/dL (ref 2.2–3.9)
Total Protein ELP: 6.3 g/dL (ref 6.0–8.5)

## 2022-08-05 ENCOUNTER — Ambulatory Visit (INDEPENDENT_AMBULATORY_CARE_PROVIDER_SITE_OTHER): Payer: Medicare Other

## 2022-08-05 DIAGNOSIS — L501 Idiopathic urticaria: Secondary | ICD-10-CM

## 2022-08-07 NOTE — Progress Notes (Signed)
[facility]  [address] [place],  [place]  [id number] [phone]  Clinic Day: 07/16/22  Referring physician: Raina Mina., MD   ASSESSMENT & PLAN:   Leukopenia At present this is resolved and is not associated with neutropenia.  I reviewed her blood smear and it is unremarkable.  I suspect she may have cyclic neutropenia which is a benign self-limited condition usually seen in females, where the white count fluctuates up and down.  I am checking for other causes such as multiple myeloma or nutritional deficiency.  Another possibility would be her medications and I will look into that.  Mild thrombocytopenia This is also resolved at present.  History of deep venous thrombosis in 2012 This was the lower left lower extremity and she was on oral contraceptives at the time.   Her blood counts have currently normalized but we discussed the possibility of cyclic neutropenia or medication effect.  I will check for any treatable causes and mainly recommend that this just be monitored.  I will call her with these results.  I will see her back in 2 months with repeat CBC just to see if it remains normal, and then release her back to Dr. Bea Graff for follow-up.  I discussed the assessment and treatment plan with the patient.  The patient was provided an opportunity to ask questions and all were answered.  The patient agreed with the plan and demonstrated an understanding of the instructions.  The patient was advised to call back if the symptoms worsen or if the condition fails to improve as anticipated.  Thank you for the opportunity to participate in the care of your patients.  I provided 35 minutes of face-to-face time during this this encounter and > 50% was spent counseling as documented under my assessment and plan.   ADDENDUM:  Review of her medications reveals that it is unlikely related to this.  This is rarely related reported with famotidine, less than  1% and rarely reported with diltiazem and omeprazole.  Xolair is reported to cause thrombocytopenia but not other blood count abnormalities.  ADDENDUM: B12 and folate are normal.  SPEP reveals no monoclonal spike.  I do not feel she needs hematology follow-up for bone marrow unless these blood counts progressed significantly.   Derwood Kaplan, MD [facility] [address] [place] [address] Dept: [phone] Dept Fax: [phone]   CHIEF COMPLAINT:  CC: Pancytopenia  Current Treatment: Evaluation   HISTORY OF PRESENT ILLNESS:  Heidi Raymond is a 66 y.o. female with a history of pancytopenia who is referred in consultation with Dr. Gilford Rile or assessment and management.  The main abnormality has been a low white count and she tells me this has been gradually decreasing since 2015.  I just have the most recent CBC which was on June 17, 2022.  Her white count was 3.0, hemoglobin 13.5 with an MCV of 91.3 and platelet count of 128,000.  She denies report infections, weight loss, fevers, chills or night sweats.  She denies any liver disease she denies severe bone pain or severe arthritis.  There is no family history of blood dyscrasias.  She is a former smoker but apparently still occasionally smokes a cigarette.  Menstrual history reveals she had menarche at age 7 and menopause in her early 75's.  She is nulliparous.  She has her mammograms yearly and breast exams through Dr. Vania Rea in [place].  INTERVAL HISTORY:  I have reviewed her chart and materials related to her hematology extensively and collaborated history with the patient.  Heidi Raymond is seen in the clinic for follow up of her leukopenia and mild thrombocytopenia. She denies fever, chills, night sweats, or other signs of infection. She denies cardiorespiratory and gastrointestinal issues. She  denies pain. Her appetite is good.  Today her white count  is up to 3.9, which is officially in the normal range.  The Sigel is 2.42.  Hemoglobin is 12.4 with an MCV of 91 and platelet count is 139,000.  This is a normal CBC but I would continued with the evaluation since she tells me this has fluctuated over time.  She denies any liver disease.  CMP reveals a BUN of 20, SGOT of 39 and SGPT of 36 and is otherwise normal.  I really do not consider these significant abnormalities.  She does tell me that she has gradually decreasing white count since 2015.  She has many comorbidities and many medications.  Thyroid was normal.  She does report occasional hot flash.  She does not have excessive bruising or bleeding.  HISTORY:   Past Medical History:  Diagnosis Date   A-fib ([place])    Anxiety    GERD (gastroesophageal reflux disease)    Thyroid disease    Urticaria   Deep venous thrombosis in October 2012 of the left lower extremity (she was on oral contraceptives at that time)   Past Surgical History:  Procedure Laterality Date   TUMOR REMOVAL     behind ear    Family History  Problem Relation Age of Onset   Allergic rhinitis Neg Hx    Angioedema Neg Hx    Asthma Neg Hx    Atopy Neg Hx    Eczema Neg Hx    Immunodeficiency Neg Hx    Urticaria Neg Hx   Her maternal aunt had colon cancer at age 34 A maternal great grandmother had breast cancer.  Social History:  reports that she has quit smoking. Her smoking use included cigarettes. She has never used smokeless tobacco. She reports that she does not currently use alcohol. She reports that she does not use drugs.The patient is alone today.  She is married and has 2 stepsons.  Allergies:  Allergies  Allergen Reactions   Nuprin [Ibuprofen] Other (See Comments)    FOREHEAD BREAKS OUT    Current Medications: Current Outpatient Medications  Medication Sig Dispense Refill   ALPRAZolam (XANAX) 0.25 MG tablet Take 0.25 mg by mouth as needed.     B Complex Vitamins (B COMPLEX PO) Take by mouth daily.      CALCIUM PO Take 500 mg by mouth daily.     cholecalciferol (VITAMIN D) 1000 UNITS tablet Take 1,000 Units by mouth daily.     diltiazem (CARDIZEM CD) 120 MG 24 hr capsule Take 120 mg by mouth daily.     ELIQUIS 5 MG TABS tablet Take 5 mg by mouth 2 (two) times daily.     Estradiol 10 MCG TABS vaginal tablet Place vaginally.     famotidine (PEPCID) 20 MG tablet Take 20 mg by mouth at bedtime.     levothyroxine (SYNTHROID) 25 MCG tablet Take 25 mcg by mouth daily.     Lutein 10 MG TABS Take 10 mg by mouth daily.     MAGNESIUM CARBONATE PO Take by mouth every other day.     omeprazole (PRILOSEC) 40 MG capsule Take 40 mg by mouth  daily.     polyethylene glycol (MIRALAX / GLYCOLAX) 17 g packet Take 17 g by mouth daily.     vitamin C (ASCORBIC ACID) 500 MG tablet Take 500 mg by mouth daily.     Wheat Dextrin (BENEFIBER PO) Take by mouth. 1 tsp nightly     Current Facility-Administered Medications  Medication Dose Route Frequency Provider Last Rate Last Admin   omalizumab Arvid Right) prefilled syringe 300 mg  300 mg Subcutaneous Q28 days Jiles Prows, MD   300 mg at 08/05/22 1101    REVIEW OF SYSTEMS:  Review of Systems  Constitutional: Negative.   HENT:   Positive for trouble swallowing and voice change.        She was seen by an ENT doctor in [facility] for hoarseness.  Eyes: Negative.   Respiratory: Negative.    Cardiovascular: Negative.   Gastrointestinal: Negative.   Endocrine: Positive for hot flashes.  Genitourinary: Negative.    Musculoskeletal: Negative.   Skin:  Positive for itching.       Urticaria, being treated by Dr. Neldon Mc with Xolair  Neurological: Negative.   Hematological: Negative.   Psychiatric/Behavioral:  The patient is nervous/anxious.      VITALS:  Blood pressure (!) 109/56, pulse 72, temperature (!) 97.4 F (36.3 C), temperature source Oral, resp. rate 17, height 5' 3.5" (1.613 m), weight 118 lb 1.6 oz (53.6 kg), SpO2 95 %.  Wt Readings from Last 3  Encounters:  07/16/22 118 lb 1.6 oz (53.6 kg)  01/11/22 119 lb 6.4 oz (54.2 kg)  06/18/21 117 lb 9.6 oz (53.3 kg)    Body mass index is 20.59 kg/m.  Performance status (ECOG): 0 - Asymptomatic  PHYSICAL EXAM:  Physical Exam Constitutional:      Appearance: Normal appearance.  HENT:     Head: Normocephalic and atraumatic.     Nose: Nose normal.     Mouth/Throat:     Pharynx: Oropharynx is clear.  Eyes:     Extraocular Movements: Extraocular movements intact.     Conjunctiva/sclera: Conjunctivae normal.     Pupils: Pupils are equal, round, and reactive to light.  Cardiovascular:     Rate and Rhythm: Normal rate.     Heart sounds: Normal heart sounds.  Pulmonary:     Effort: Pulmonary effort is normal.     Breath sounds: Normal breath sounds.  Abdominal:     Palpations: Abdomen is soft.  Musculoskeletal:        General: Normal range of motion.     Cervical back: Normal range of motion and neck supple.  Skin:    General: Skin is warm and dry.  Neurological:     General: No focal deficit present.     Mental Status: She is alert and oriented to person, place, and time.  Psychiatric:        Mood and Affect: Mood normal.        Behavior: Behavior normal.        Thought Content: Thought content normal.        Judgment: Judgment normal.    LABS:      Latest Ref Rng & Units 07/16/2022   12:00 AM  CBC  WBC  3.9      Hemoglobin 12.0 - 16.0 12.4      Hematocrit 36 - 46 37      Platelets 150 - 400 K/uL 139         This result is from an external source.  Latest Ref Rng & Units 07/16/2022   12:00 AM  CMP  BUN 4 - 21 20      Creatinine 0.5 - 1.1 0.8      Sodium 137 - 147 140      Potassium 3.5 - 5.1 mEq/L 4.3      Chloride 99 - 108 102      CO2 13 - 22 35      Calcium 8.7 - 10.7 9.6      Alkaline Phos 25 - 125 94      AST 13 - 35 39      ALT 7 - 35 U/L 36         This result is from an external source.     No results found for: "CEA1", "CEA" / No results  found for: "CEA1", "CEA" No results found for: "PSA1" No results found for: "HSJ290" No results found for: "CAN125"  Lab Results  Component Value Date   TOTALPROTELP 6.3 07/16/2022   ALBUMINELP 3.7 07/16/2022   A1GS 0.2 07/16/2022   A2GS 0.6 07/16/2022   BETS 0.8 07/16/2022   GAMS 1.0 07/16/2022   MSPIKE Not Observed 07/16/2022   SPEI Comment 07/16/2022   No results found for: "TIBC", "FERRITIN", "IRONPCTSAT" No results found for: "LDH"  STUDIES:  No results found.

## 2022-08-09 ENCOUNTER — Telehealth: Payer: Self-pay

## 2022-08-09 NOTE — Telephone Encounter (Signed)
-----  Message from Derwood Kaplan, MD sent at 08/07/2022  1:28 PM EDT ----- Regarding: call Can tell her B12 and folate normal, no sign of multiple myeloma

## 2022-08-09 NOTE — Telephone Encounter (Signed)
Patient notified

## 2022-09-15 ENCOUNTER — Telehealth: Payer: Self-pay | Admitting: Oncology

## 2022-09-15 ENCOUNTER — Encounter: Payer: Self-pay | Admitting: Oncology

## 2022-09-15 ENCOUNTER — Inpatient Hospital Stay: Payer: Medicare Other

## 2022-09-15 ENCOUNTER — Inpatient Hospital Stay: Payer: Medicare Other | Attending: Oncology | Admitting: Oncology

## 2022-09-15 ENCOUNTER — Other Ambulatory Visit: Payer: Self-pay | Admitting: Oncology

## 2022-09-15 VITALS — BP 125/60 | HR 62 | Resp 18 | Ht 63.5 in | Wt 119.3 lb

## 2022-09-15 DIAGNOSIS — D72819 Decreased white blood cell count, unspecified: Secondary | ICD-10-CM

## 2022-09-15 DIAGNOSIS — D696 Thrombocytopenia, unspecified: Secondary | ICD-10-CM | POA: Diagnosis not present

## 2022-09-15 LAB — CBC AND DIFFERENTIAL
HCT: 37 (ref 36–46)
Hemoglobin: 12.4 (ref 12.0–16.0)
Neutrophils Absolute: 1.82
Platelets: 128 10*3/uL — AB (ref 150–400)
WBC: 3.2

## 2022-09-15 LAB — CBC: RBC: 4.05 (ref 3.87–5.11)

## 2022-09-15 NOTE — Telephone Encounter (Signed)
Patient has been scheduled for follow-up visit per 09/15/22 los. Pt given an appt calendar with date and time.  

## 2022-09-15 NOTE — Progress Notes (Signed)
Heidi Raymond  [address] [place],  [place]  [address] [phone]  Clinic Day: 09/15/22  Referring physician: Raina Mina., MD   ASSESSMENT & PLAN:   Leukopenia I reviewed her blood smear and it is unremarkable.  I suspect she may have cyclic neutropenia which is a benign self-limited condition usually seen in females, where the white count fluctuates up and down.  Evaluation for other causes such as multiple myeloma or nutritional deficiency has been negative.  However I cannot rule out possible early myelodysplasia, especially with her mild monocytosis of 13%.  Mild thrombocytopenia Her platelet count has dropped again from 139,000 to 128,000.  History of deep venous thrombosis in 2012 This was the lower left lower extremity and she was on oral contraceptives at the time.   Her blood counts have dropped again and I explained the diagnosis of cyclic neutropenia.  None of her medications are likely to cause this and her vitamin levels are normal.  She has no monoclonal spike.  It is possible this represents an early myelodysplasia, especially since she has 2 cell lines affected, and a mild monocytosis of 13%.  I have recommended that we simply monitor her counts and so I will see her back in 6 months with repeat CBC.  I discussed the assessment and treatment plan with the patient.  The patient was provided an opportunity to ask questions and all were answered.  The patient agreed with the plan and demonstrated an understanding of the instructions.  The patient was advised to call back if the symptoms worsen or if the condition fails to improve as anticipated.   I provided 15 minutes of face-to-face time during this this encounter and > 50% was spent counseling as documented under my assessment and plan.   Derwood Kaplan, MD [facility] [address] [place] [address] Dept:  [phone] Dept Fax: [phone]   CHIEF COMPLAINT:  CC: Pancytopenia  Current Treatment: Evaluation   HISTORY OF PRESENT ILLNESS:  Heidi Raymond is a 66 y.o. female with a history of pancytopenia who is referred in consultation with Dr. Gilford Rile or assessment and management.  The main abnormality has been a low white count and she tells me this has been gradually decreasing since 2015.  I just have the most recent CBC which was on June 17, 2022.  Her white count was 3.0, hemoglobin 13.5 with an MCV of 91.3 and platelet count of 128,000.  When I last rechecked her blood counts in September, her white count was up to 3.9 and her platelet count to 139,000 with a normal hemoglobin.  My conclusion was that this likely represents cyclic neutropenia. She denies report infections, weight loss, fevers, chills or night sweats.  She denies any liver disease.  She denies severe bone pain or severe arthritis.  There is no family history of blood dyscrasias.  INTERVAL HISTORY:  I have reviewed her chart and materials related to her hematology extensively and collaborated history with the patient.  Heidi Raymond is seen in the clinic for follow up of her leukopenia and mild thrombocytopenia. Today her white count is back down to 3.2 with a normal differential other than 13.1% monocytes.  Her ANC is 1800 and her hemoglobin is normal at 12.4 but her platelet count has dropped back down to 128,000.  She denies fever, chills, night sweats, or other signs of infection. She denies cardiorespiratory and gastrointestinal  issues. She  denies pain. Her appetite is good.  She does tell me that she has gradually decreasing white count since 2015.  She has many comorbidities and many medications.  Vitamin levels were normal and serum protein electrophoresis was negative for monoclonal spike.  She does not have excessive bruising or bleeding.  It is possible that this represents early myelodysplasia since 2 cell lines are  affected and she does have a mild monocytosis.  I do not feel we need to pursue a bone marrow since her blood counts are just mildly affected.  I will monitor periodically.  HISTORY:   Past Medical History:  Diagnosis Date   A-fib ([place])    Anxiety    GERD (gastroesophageal reflux disease)    Thyroid disease    Urticaria   Deep venous thrombosis in October 2012 of the left lower extremity (she was on oral contraceptives at that time)   Past Surgical History:  Procedure Laterality Date   TUMOR REMOVAL     behind ear    Family History  Problem Relation Age of Onset   Allergic rhinitis Neg Hx    Angioedema Neg Hx    Asthma Neg Hx    Atopy Neg Hx    Eczema Neg Hx    Immunodeficiency Neg Hx    Urticaria Neg Hx   Her maternal aunt had colon cancer at age 51 A maternal great grandmother had breast cancer.  Social History:  reports that she has quit smoking. Her smoking use included cigarettes. She has never used smokeless tobacco. She reports that she does not currently use alcohol. She reports that she does not use drugs.The patient is alone today.  She is married and has 2 stepsons.  Allergies:  Allergies  Allergen Reactions   Nuprin [Ibuprofen] Other (See Comments)    FOREHEAD BREAKS OUT    Current Medications: Current Outpatient Medications  Medication Sig Dispense Refill   ALPRAZolam (XANAX) 0.25 MG tablet Take 0.25 mg by mouth as needed.     B Complex Vitamins (B COMPLEX PO) Take by mouth daily.     CALCIUM PO Take 500 mg by mouth daily.     cholecalciferol (VITAMIN D) 1000 UNITS tablet Take 1,000 Units by mouth daily.     diltiazem (CARDIZEM CD) 120 MG 24 hr capsule Take 120 mg by mouth daily.     ELIQUIS 5 MG TABS tablet Take 5 mg by mouth 2 (two) times daily.     Estradiol 10 MCG TABS vaginal tablet Place vaginally.     famotidine (PEPCID) 20 MG tablet Take 20 mg by mouth at bedtime.     levothyroxine (SYNTHROID) 25 MCG tablet Take 25 mcg by mouth daily.      Lutein 10 MG TABS Take 10 mg by mouth daily.     MAGNESIUM CARBONATE PO Take by mouth every other day.     omalizumab Arvid Right) 150 MG/ML prefilled syringe Inject 300 mg into the skin every 28 (twenty-eight) days. 2 mL 11   omeprazole (PRILOSEC) 40 MG capsule Take 40 mg by mouth daily.     polyethylene glycol (MIRALAX / GLYCOLAX) 17 g packet Take 17 g by mouth daily.     vitamin C (ASCORBIC ACID) 500 MG tablet Take 500 mg by mouth daily.     Wheat Dextrin (BENEFIBER PO) Take by mouth. 1 tsp nightly     Current Facility-Administered Medications  Medication Dose Route Frequency Provider Last Rate Last Admin   omalizumab Arvid Right) prefilled  syringe 300 mg  300 mg Subcutaneous Q28 days Jiles Prows, MD   300 mg at 09/16/22 1635    REVIEW OF SYSTEMS:  Review of Systems  Constitutional: Negative.   HENT:          She was seen by an ENT doctor in [facility] for hoarseness.  Eyes: Negative.   Respiratory: Negative.    Cardiovascular: Negative.   Gastrointestinal: Negative.   Endocrine: Positive for hot flashes.  Genitourinary: Negative.    Musculoskeletal: Negative.   Skin:  Positive for itching.       Urticaria, being treated by Dr. Neldon Mc with Xolair  Neurological: Negative.   Hematological: Negative.   Psychiatric/Behavioral:  The patient is nervous/anxious.       VITALS:  Blood pressure 125/60, pulse 62, resp. rate 18, height 5' 3.5" (1.613 m), weight 119 lb 4.8 oz (54.1 kg), SpO2 96 %.  Wt Readings from Last 3 Encounters:  09/15/22 119 lb 4.8 oz (54.1 kg)  07/16/22 118 lb 1.6 oz (53.6 kg)  01/11/22 119 lb 6.4 oz (54.2 kg)    Body mass index is 20.8 kg/m.  Performance status (ECOG): 0 - Asymptomatic  PHYSICAL EXAM:  Physical Exam Constitutional:      Appearance: Normal appearance.  HENT:     Head: Normocephalic and atraumatic.     Nose: Nose normal.     Mouth/Throat:     Pharynx: Oropharynx is clear.  Eyes:     Extraocular Movements: Extraocular movements intact.      Conjunctiva/sclera: Conjunctivae normal.     Pupils: Pupils are equal, round, and reactive to light.  Cardiovascular:     Rate and Rhythm: Normal rate.     Heart sounds: Normal heart sounds.  Pulmonary:     Effort: Pulmonary effort is normal.     Breath sounds: Normal breath sounds.  Abdominal:     Palpations: Abdomen is soft.  Musculoskeletal:        General: Normal range of motion.     Cervical back: Normal range of motion and neck supple.  Skin:    General: Skin is warm and dry.  Neurological:     General: No focal deficit present.     Mental Status: She is alert and oriented to person, place, and time.  Psychiatric:        Mood and Affect: Mood normal.        Behavior: Behavior normal.        Thought Content: Thought content normal.        Judgment: Judgment normal.     LABS:      Latest Ref Rng & Units 09/15/2022   12:00 AM 07/16/2022   12:00 AM  CBC  WBC  3.2     3.9      Hemoglobin 12.0 - 16.0 12.4     12.4      Hematocrit 36 - 46 37     37      Platelets 150 - 400 K/uL 128     139         This result is from an external source.      Latest Ref Rng & Units 07/16/2022   12:00 AM  CMP  BUN 4 - 21 20      Creatinine 0.5 - 1.1 0.8      Sodium 137 - 147 140      Potassium 3.5 - 5.1 mEq/L 4.3      Chloride 99 - 108  102      CO2 13 - 22 35      Calcium 8.7 - 10.7 9.6      Alkaline Phos 25 - 125 94      AST 13 - 35 39      ALT 7 - 35 U/L 36         This result is from an external source.     No results found for: "CEA1", "CEA" / No results found for: "CEA1", "CEA" No results found for: "PSA1" No results found for: "QSX282" No results found for: "CAN125"  Lab Results  Component Value Date   TOTALPROTELP 6.3 07/16/2022   ALBUMINELP 3.7 07/16/2022   A1GS 0.2 07/16/2022   A2GS 0.6 07/16/2022   BETS 0.8 07/16/2022   GAMS 1.0 07/16/2022   MSPIKE Not Observed 07/16/2022   SPEI Comment 07/16/2022   No results found for: "TIBC", "FERRITIN",  "IRONPCTSAT" No results found for: "LDH"  STUDIES:  No results found.

## 2022-09-16 ENCOUNTER — Ambulatory Visit (INDEPENDENT_AMBULATORY_CARE_PROVIDER_SITE_OTHER): Payer: Medicare Other | Admitting: *Deleted

## 2022-09-16 DIAGNOSIS — L501 Idiopathic urticaria: Secondary | ICD-10-CM

## 2022-09-20 ENCOUNTER — Encounter: Payer: Self-pay | Admitting: Allergy and Immunology

## 2022-09-20 ENCOUNTER — Ambulatory Visit: Payer: Medicare Other | Admitting: Allergy and Immunology

## 2022-09-20 VITALS — BP 118/78 | HR 63 | Resp 16

## 2022-09-20 DIAGNOSIS — L989 Disorder of the skin and subcutaneous tissue, unspecified: Secondary | ICD-10-CM

## 2022-09-20 DIAGNOSIS — L501 Idiopathic urticaria: Secondary | ICD-10-CM

## 2022-09-20 NOTE — Patient Instructions (Addendum)
  1.  Consistently use Xolair 300 mg monthly and EpiPen   2. Continue as needed use of over-the-counter antihistamine  3. Start sample of Opzulera to inflamed patches 2 times per day   4. If needed:  A. Derma-Smoothe scalp oil one time per day B. Triamcinolone 0.1% cream applied 1-2 times per day    5.  Exchange omalizumab for dupilumab???  6. Return to clinic in 1 year or earlier if problem

## 2022-09-20 NOTE — Progress Notes (Unsigned)
[facility] [place] [facility] [place]   Follow-up Note  Referring Provider: Raina Mina., MD Primary Provider: Raina Mina., MD Date of Office Visit: 09/20/2022  Subjective:   Heidi Raymond (DOB: [birth year]) is a 66 y.o. female who returns to the [facility] on 09/20/2022 in re-evaluation of the following:  HPI: Heidi Raymond turns this clinic in evaluation of urticaria and inflammatory dermatosis.  I last saw her in this clinic on 07 January 2022.  She slowly attempted to decrease the milligrams of omalizumab and increase the interval of omalizumab administration but unfortunately that resulted in loss of control of her urticaria.  As well, she has had a few more patches of eczema show up on her skin that is not responding to topical triamcinolone.  As well, she has developed LPR with a change in voice and burning up in her throat and she visited with ENT who started her on omeprazole and she is eliminated all caffeine and chocolate consumption this is much better at this point in time.  Allergies as of 09/20/2022       Reactions   Nuprin [ibuprofen] Other (See Comments)   FOREHEAD BREAKS OUT        Medication List    ALPRAZolam 0.25 MG tablet Commonly known as: XANAX Take 0.25 mg by mouth as needed.   ascorbic acid 500 MG tablet Commonly known as: VITAMIN C Take 500 mg by mouth daily.   B COMPLEX PO Take by mouth daily.   BENEFIBER PO Take by mouth. 1 tsp nightly   CALCIUM PO Take 500 mg by mouth daily.   cholecalciferol 1000 units tablet Commonly known as: VITAMIN D Take 1,000 Units by mouth daily.   diltiazem 120 MG 24 hr capsule Commonly known as: CARDIZEM CD Take 120 mg by mouth daily.   Eliquis 5 MG Tabs tablet Generic drug: apixaban Take 5 mg by mouth 2 (two) times daily.   Estradiol 10 MCG Tabs vaginal tablet Place vaginally.   famotidine 20 MG tablet Commonly known as: PEPCID Take 20 mg by mouth at  bedtime.   levothyroxine 25 MCG tablet Commonly known as: SYNTHROID Take 25 mcg by mouth daily.   Lutein 10 MG Tabs Take 10 mg by mouth daily.   MAGNESIUM CARBONATE PO Take by mouth every other day.   omeprazole 40 MG capsule Commonly known as: PRILOSEC Take 40 mg by mouth daily.   polyethylene glycol 17 g packet Commonly known as: MIRALAX / GLYCOLAX Take 17 g by mouth daily.    Past Medical History:  Diagnosis Date   A-fib ([place])    Anxiety    GERD (gastroesophageal reflux disease)    Thyroid disease    Urticaria     Past Surgical History:  Procedure Laterality Date   TUMOR REMOVAL     behind ear    Review of systems negative except as noted in HPI / PMHx or noted below:  Review of Systems  Constitutional: Negative.   HENT: Negative.    Eyes: Negative.   Respiratory: Negative.    Cardiovascular: Negative.   Gastrointestinal: Negative.   Genitourinary: Negative.   Musculoskeletal: Negative.   Skin: Negative.   Neurological: Negative.   Endo/Heme/Allergies: Negative.   Psychiatric/Behavioral: Negative.       Objective:   Vitals:   09/20/22 1704  BP: 118/78  Pulse: 63  Resp: 16  SpO2: 96%          Physical Exam  Constitutional:      Appearance: She is not diaphoretic.  HENT:     Head: Normocephalic.     Right Ear: Tympanic membrane, ear canal and external ear normal.     Left Ear: Tympanic membrane, ear canal and external ear normal.     Nose: Nose normal. No mucosal edema or rhinorrhea.     Mouth/Throat:     Pharynx: Uvula midline. No oropharyngeal exudate.  Eyes:     Conjunctiva/sclera: Conjunctivae normal.  Neck:     Thyroid: No thyromegaly.     Trachea: Trachea normal. No tracheal tenderness or tracheal deviation.  Cardiovascular:     Rate and Rhythm: Normal rate and regular rhythm.     Heart sounds: Normal heart sounds, S1 normal and S2 normal. No murmur heard. Pulmonary:     Effort: No respiratory distress.     Breath sounds:  Normal breath sounds. No stridor. No wheezing or rales.  Lymphadenopathy:     Head:     Right side of head: No tonsillar adenopathy.     Left side of head: No tonsillar adenopathy.     Cervical: No cervical adenopathy.  Skin:    Findings: Rash (Patch is a very modest like in the side slightly scaly skin right shoulder, post very her neck, centered back) present. No erythema.     Nails: There is no clubbing.  Neurological:     Mental Status: She is alert.     Diagnostics: none  Assessment and Plan:   1. Idiopathic urticaria   2. Inflammatory dermatosis    1.  Consistently use Xolair 300 mg monthly and EpiPen   2. Continue as needed use of over-the-counter antihistamine  3. Start sample of Opzulera to inflamed patches 2 times per day   4. If needed:  A. Derma-Smoothe scalp oil one time per day B. Triamcinolone 0.1% cream applied 1-2 times per day    5.  Exchange omalizumab for dupilumab???  6. Return to clinic in 1 year or earlier if problem   We will have Cindy increase her dose of Xolair to 300 mg every month as decreasing the dose and increasing the interval did not appear to work out very well for her.  And I given her a topical Jak inhibitor to help with some of the stubborn areas of inflammatory dermatosis.  Should she not respond well to this plan then we may give her dupilumab to replace her Xolair as this should cover not just her urticaria but also her inflammatory dermatosis.  [name], MD Allergy / Immunology [facility]

## 2022-09-21 ENCOUNTER — Encounter: Payer: Self-pay | Admitting: Allergy and Immunology

## 2022-09-21 ENCOUNTER — Telehealth: Payer: Self-pay | Admitting: *Deleted

## 2022-09-21 MED ORDER — OMALIZUMAB 150 MG/ML ~~LOC~~ SOSY: 300.0000 mg | PREFILLED_SYRINGE | SUBCUTANEOUS | 11 refills | Status: DC

## 2022-09-21 NOTE — Telephone Encounter (Signed)
Called patient and advised dose change to 300mg  every 28 days and r/s next appt to 4 weeks instead of 6. New Rx to PAP sent for new dose starting next njs

## 2022-09-21 NOTE — Telephone Encounter (Signed)
-----   Message from Heidi Raymond, [place] sent at 09/20/2022  5:55 PM EST ----- Regarding: Arvid Right Dr. Neldon Mc wants patient to go back to Xolair 300 every 4 weeks

## 2022-10-14 ENCOUNTER — Ambulatory Visit (INDEPENDENT_AMBULATORY_CARE_PROVIDER_SITE_OTHER): Payer: Medicare Other | Admitting: *Deleted

## 2022-10-14 DIAGNOSIS — L501 Idiopathic urticaria: Secondary | ICD-10-CM | POA: Diagnosis not present

## 2022-10-28 ENCOUNTER — Ambulatory Visit: Payer: Medicare Other

## 2022-11-11 ENCOUNTER — Ambulatory Visit (INDEPENDENT_AMBULATORY_CARE_PROVIDER_SITE_OTHER): Payer: Medicare Other | Admitting: *Deleted

## 2022-11-11 DIAGNOSIS — L501 Idiopathic urticaria: Secondary | ICD-10-CM | POA: Diagnosis not present

## 2022-12-09 ENCOUNTER — Ambulatory Visit (INDEPENDENT_AMBULATORY_CARE_PROVIDER_SITE_OTHER): Payer: Medicare Other | Admitting: *Deleted

## 2022-12-09 DIAGNOSIS — L501 Idiopathic urticaria: Secondary | ICD-10-CM | POA: Diagnosis not present

## 2023-01-06 ENCOUNTER — Ambulatory Visit (INDEPENDENT_AMBULATORY_CARE_PROVIDER_SITE_OTHER): Payer: Medicare Other | Admitting: *Deleted

## 2023-01-06 DIAGNOSIS — L501 Idiopathic urticaria: Secondary | ICD-10-CM

## 2023-02-03 ENCOUNTER — Ambulatory Visit (INDEPENDENT_AMBULATORY_CARE_PROVIDER_SITE_OTHER): Payer: Medicare Other

## 2023-02-03 DIAGNOSIS — L501 Idiopathic urticaria: Secondary | ICD-10-CM

## 2023-03-03 ENCOUNTER — Ambulatory Visit (INDEPENDENT_AMBULATORY_CARE_PROVIDER_SITE_OTHER): Payer: Medicare Other | Admitting: *Deleted

## 2023-03-03 DIAGNOSIS — L501 Idiopathic urticaria: Secondary | ICD-10-CM | POA: Diagnosis not present

## 2023-03-15 ENCOUNTER — Other Ambulatory Visit: Payer: Self-pay

## 2023-03-16 ENCOUNTER — Inpatient Hospital Stay: Payer: Medicare Other

## 2023-03-16 ENCOUNTER — Inpatient Hospital Stay: Payer: Medicare Other | Admitting: Hematology and Oncology

## 2023-03-22 ENCOUNTER — Other Ambulatory Visit: Payer: Medicare Other

## 2023-03-22 ENCOUNTER — Ambulatory Visit: Payer: Medicare Other | Admitting: Hematology and Oncology

## 2023-03-22 NOTE — Progress Notes (Signed)
[facility]  [address] [place],  [place]  [address] [phone]  Clinic Day: 03/23/23  Referring physician: Gordan Payment., MD   ASSESSMENT & PLAN:  ASSESSMENT:  Leukopenia I reviewed her blood smear and it is unremarkable.  I suspect she may have cyclic neutropenia which is a benign self-limited condition usually seen in females, where the white count fluctuates up and down.  Evaluation for other causes such as multiple myeloma or nutritional deficiency has been negative.  However I cannot rule out possible early myelodysplasia, especially with her mild monocytosis of 13%.  Mild thrombocytopenia Her platelet count has improved again from 128,000 to 149,000.  History of deep venous thrombosis in 2012 This was the lower left lower extremity and she was on oral contraceptives at the time.  Plan Her WBC's are back up to 4.0 again, consistent with the diagnosis of cyclic neutropenia.  None of her medications are likely to cause this and her vitamin levels were normal.  She has no monoclonal spike.  It is possible this represents an early myelodysplasia, especially since she has 2 cell lines affected, and a mild monocytosis of 13%.  I have recommended that we simply monitor her counts and so I will see her back in 6 months with repeat CBC.  I discussed the assessment and treatment plan with the patient.  The patient was provided an opportunity to ask questions and all were answered.  The patient agreed with the plan and demonstrated an understanding of the instructions.  The patient was advised to call back if the symptoms worsen or if the condition fails to improve as anticipated.   I provided 15 minutes of face-to-face time during this this encounter and > 50% was spent counseling as documented under my assessment and plan.   Dellia Beckwith, MD [facility] AT [facility] [address]  [place] [address] Dept: [phone] Dept Fax: [phone]   CHIEF COMPLAINT:  CC: Pancytopenia  Current Treatment: Evaluation   HISTORY OF PRESENT ILLNESS:  Heidi Raymond is a 66 y.o. female with a history of pancytopenia who is referred in consultation with Dr. Feliciana Rossetti or assessment and management.  The main abnormality has been a low white count and she tells me this has been gradually decreasing since 2015.  I just have the most recent CBC which was on June 17, 2022.  Her white count was 3.0, hemoglobin 13.5 with an MCV of 91.3 and platelet count of 128,000.  When I last rechecked her blood counts in September, her white count was up to 3.9 and her platelet count to 139,000 with a normal hemoglobin.  My conclusion was that this likely represents cyclic neutropenia. She denies report infections, weight loss, fevers, chills or night sweats.  She denies any liver disease.  She denies severe bone pain or severe arthritis.  There is no family history of blood dyscrasias.  INTERVAL HISTORY:  Heidi Raymond is seen in the clinic for follow up of her leukopenia and mild thrombocytopenia. Patient states that she feels fair but has had a lot going on with her family. Her mother expired in November and her father is 17 years old, with failing memory.  Now her sister has pneumonia. She has no changes in her medications. She denies signs of infection such as sore throat, sinus drainage, cough, or urinary symptoms.  She denies fevers or recurrent chills. She denies pain. She denies nausea, vomiting,  chest pain, dyspnea or cough. Her appetite is good, but her weight has decreased 7 pounds over last 6 months .  She has a gradually decreasing white count since 2015, but it fluctuates up and down.  She has many comorbidities and many medications.  Vitamin levels were normal and serum protein electrophoresis was negative for monoclonal spike.  She does not have excessive bruising or bleeding.  It is possible  that this represents early myelodysplasia since 2 cell lines are affected and she does have a mild monocytosis.  I do not feel we need to pursue a bone marrow since her blood counts are just mildly affected.  I will monitor periodically. Her WBC's today are back up to 4.0 from 3.2 last time and platelets also improved, from 128,000 to 149,000.  HISTORY:   Past Medical History:  Diagnosis Date   A-fib (HCC)    Anxiety    GERD (gastroesophageal reflux disease)    Thyroid disease    Urticaria   Deep venous thrombosis in October 2012 of the left lower extremity (she was on oral contraceptives at that time)   Past Surgical History:  Procedure Laterality Date   TUMOR REMOVAL     behind ear    Family History  Problem Relation Age of Onset   Allergic rhinitis Neg Hx    Angioedema Neg Hx    Asthma Neg Hx    Atopy Neg Hx    Eczema Neg Hx    Immunodeficiency Neg Hx    Urticaria Neg Hx   Her maternal aunt had colon cancer at age 25 A maternal great grandmother had breast cancer.  Social History:  reports that she has quit smoking. Her smoking use included cigarettes. She has never used smokeless tobacco. She reports that she does not currently use alcohol. She reports that she does not use drugs.The patient is alone today.  She is married and has 2 stepsons.  Allergies:  Allergies  Allergen Reactions   Nuprin [Ibuprofen] Other (See Comments)    FOREHEAD BREAKS OUT    Current Medications: Current Outpatient Medications  Medication Sig Dispense Refill   ALPRAZolam (XANAX) 0.25 MG tablet Take 0.25 mg by mouth as needed.     B Complex Vitamins (B COMPLEX PO) Take by mouth daily.     CALCIUM PO Take 500 mg by mouth daily.     cholecalciferol (VITAMIN D) 1000 UNITS tablet Take 1,000 Units by mouth daily.     Cholecalciferol 10 MCG (400 UNIT) CAPS Take by mouth.     diltiazem (CARDIZEM CD) 120 MG 24 hr capsule Take 120 mg by mouth daily.     ELIQUIS 5 MG TABS tablet Take 5 mg by mouth  2 (two) times daily.     Estradiol 10 MCG TABS vaginal tablet Place vaginally.     famotidine (PEPCID) 20 MG tablet Take 20 mg by mouth at bedtime.     levothyroxine (SYNTHROID) 25 MCG tablet Take 25 mcg by mouth daily.     Lutein 10 MG TABS Take 10 mg by mouth daily.     MAGNESIUM CARBONATE PO Take by mouth every other day.     omalizumab Geoffry Paradise) 150 MG/ML prefilled syringe Inject 300 mg into the skin every 28 (twenty-eight) days. 2 mL 11   omeprazole (PRILOSEC) 40 MG capsule Take 40 mg by mouth daily.     polyethylene glycol (MIRALAX / GLYCOLAX) 17 g packet Take 17 g by mouth daily.     vitamin C (  ASCORBIC ACID) 500 MG tablet Take 500 mg by mouth daily.     Wheat Dextrin (BENEFIBER PO) Take by mouth. 1 tsp nightly     Current Facility-Administered Medications  Medication Dose Route Frequency Provider Last Rate Last Admin   omalizumab Geoffry Paradise) prefilled syringe 300 mg  300 mg Subcutaneous Q28 days Jessica Priest, MD   300 mg at 03/31/23 1644    REVIEW OF SYSTEMS:  Review of Systems  Constitutional: Negative.  Negative for appetite change, chills, fever and unexpected weight change.  HENT:  Negative.  Negative for lump/mass, mouth sores and sore throat.        She was seen by an ENT doctor in [facility] for hoarseness.  Eyes: Negative.   Respiratory: Negative.  Negative for chest tightness, cough, hemoptysis, shortness of breath and wheezing.   Cardiovascular: Negative.  Negative for chest pain, leg swelling and palpitations.  Gastrointestinal: Negative.  Negative for abdominal distention, abdominal pain, blood in stool, constipation, diarrhea, nausea and vomiting.  Endocrine: Positive for hot flashes.  Genitourinary: Negative.  Negative for difficulty urinating, dysuria, frequency and hematuria.   Musculoskeletal: Negative.  Negative for arthralgias, back pain, flank pain and gait problem.  Skin:  Positive for itching.       Urticaria, being treated by Dr. Lucie Leather with Xolair   Neurological: Negative.  Negative for dizziness, extremity weakness, gait problem, headaches, light-headedness, numbness, seizures and speech difficulty.  Hematological: Negative.  Negative for adenopathy. Does not bruise/bleed easily.  Psychiatric/Behavioral:  Negative for depression and sleep disturbance. The patient is nervous/anxious.     VITALS:  Blood pressure (!) 108/53, pulse 69, temperature 97.8 F (36.6 C), temperature source Oral, resp. rate 16, weight 112 lb 8 oz (51 kg), SpO2 97 %.  Wt Readings from Last 3 Encounters:  03/23/23 112 lb 8 oz (51 kg)  09/15/22 119 lb 4.8 oz (54.1 kg)  07/16/22 118 lb 1.6 oz (53.6 kg)    Body mass index is 19.62 kg/m.  Performance status (ECOG): 0 - Asymptomatic  PHYSICAL EXAM:  Physical Exam Vitals and nursing note reviewed. Exam conducted with a chaperone present.  Constitutional:      General: She is not in acute distress.    Appearance: Normal appearance. She is normal weight. She is not ill-appearing, toxic-appearing or diaphoretic.  HENT:     Head: Normocephalic and atraumatic.     Right Ear: Tympanic membrane, ear canal and external ear normal. There is no impacted cerumen.     Left Ear: Tympanic membrane, ear canal and external ear normal. There is no impacted cerumen.     Nose: Nose normal.     Mouth/Throat:     Mouth: Mucous membranes are moist.     Pharynx: Oropharynx is clear.  Eyes:     General: No scleral icterus.    Extraocular Movements: Extraocular movements intact.     Conjunctiva/sclera: Conjunctivae normal.     Pupils: Pupils are equal, round, and reactive to light.  Cardiovascular:     Rate and Rhythm: Normal rate and regular rhythm.     Pulses: Normal pulses.     Heart sounds: Normal heart sounds. No murmur heard.    No friction rub. No gallop.  Pulmonary:     Effort: Pulmonary effort is normal. No respiratory distress.     Breath sounds: Normal breath sounds.  Abdominal:     General: Bowel sounds are  normal. There is no distension.     Palpations: Abdomen is soft.  There is no hepatomegaly, splenomegaly or mass.     Tenderness: There is no abdominal tenderness.  Musculoskeletal:        General: Normal range of motion.     Cervical back: Normal range of motion and neck supple.     Right lower leg: No edema.     Left lower leg: No edema.  Lymphadenopathy:     Cervical: No cervical adenopathy.     Right cervical: No superficial, deep or posterior cervical adenopathy.    Left cervical: No superficial, deep or posterior cervical adenopathy.     Upper Body:     Right upper body: No supraclavicular, axillary or pectoral adenopathy.     Left upper body: No supraclavicular, axillary or pectoral adenopathy.  Skin:    General: Skin is warm and dry.  Neurological:     General: No focal deficit present.     Mental Status: She is alert and oriented to person, place, and time. Mental status is at baseline.  Psychiatric:        Mood and Affect: Mood normal.        Behavior: Behavior normal.        Thought Content: Thought content normal.        Judgment: Judgment normal.     LABS:   Component Ref Range & Units 12/30/2022  WBC 4.4 - 11.0 x 10*3/uL 3.4 Low   RBC 4.10 - 5.10 x 10*6/uL 4.31  Hemoglobin 12.3 - 15.3 G/DL 16.1  Hematocrit 09.6 - 44.6 % 40.0  MCV 80.0 - 96.0 FL 92.9  MCH 27.5 - 33.2 PG 30.9  MCHC 33.0 - 37.0 G/DL 04.5  RDW 40.9 - 81.1 % 15.7  Platelets 150 - 450 X 10*3/uL 149 Low   MPV 6.8 - 10.2 FL 8.8   Component Ref Range & Units 12/30/2022  Sodium 135 - 146 MMOL/L 140  Potassium 3.5 - 5.3 MMOL/L 5.1  Chloride 98 - 110 MMOL/L 101  CO2 21 - 31 MMOL/L 35 High   BUN 8 - 24 MG/DL 21  Glucose 70 - 99 MG/DL 54 Low   Creatinine 9.14 - 1.20 MG/DL 7.82  Calcium 8.5 - 95.6 MG/DL 9.6  Anion Gap 4 - 14 MMOL/L 4  Est. GFR >=60 ML/MIN/1.73 M*2 77   Component Ref Range & Units 12/20/2022  HEMOGLOBIN A1C <5.8 % 5.6  eAG MG/DL 213   Component Ref  Range & Units 12/20/2022  TSH 0.45 - 5.00 UIU/ML 2.55      Latest Ref Rng & Units 03/23/2023    2:26 PM 09/15/2022   12:00 AM 07/16/2022   12:00 AM  CBC  WBC 4.0 - 10.5 K/uL 4.0  3.2     3.9      Hemoglobin 12.0 - 15.0 g/dL 08.6  57.8     46.9      Hematocrit 36.0 - 46.0 % 38.4  37     37      Platelets 150 - 400 K/uL 149  128     139         This result is from an external source.      Latest Ref Rng & Units 07/16/2022   12:00 AM  CMP  BUN 4 - 21 20      Creatinine 0.5 - 1.1 0.8      Sodium 137 - 147 140      Potassium 3.5 - 5.1 mEq/L 4.3      Chloride 99 - 108 102  CO2 13 - 22 35      Calcium 8.7 - 10.7 9.6      Alkaline Phos 25 - 125 94      AST 13 - 35 39      ALT 7 - 35 U/L 36         This result is from an external source.   No results found for: "CEA1", "CEA" / No results found for: "CEA1", "CEA" No results found for: "PSA1" No results found for: "ZOX096" No results found for: "CAN125"  Lab Results  Component Value Date   TOTALPROTELP 6.3 07/16/2022   ALBUMINELP 3.7 07/16/2022   A1GS 0.2 07/16/2022   A2GS 0.6 07/16/2022   BETS 0.8 07/16/2022   GAMS 1.0 07/16/2022   MSPIKE Not Observed 07/16/2022   SPEI Comment 07/16/2022   No results found for: "TIBC", "FERRITIN", "IRONPCTSAT" No results found for: "LDH"  STUDIES:  No results found.     I,Jasmine M Lassiter,acting as a scribe for Dellia Beckwith, MD.,have documented all relevant documentation on the behalf of Dellia Beckwith, MD,as directed by  Dellia Beckwith, MD while in the presence of Dellia Beckwith, MD.

## 2023-03-23 ENCOUNTER — Other Ambulatory Visit: Payer: Self-pay | Admitting: Oncology

## 2023-03-23 ENCOUNTER — Inpatient Hospital Stay (INDEPENDENT_AMBULATORY_CARE_PROVIDER_SITE_OTHER): Payer: Medicare Other | Admitting: Oncology

## 2023-03-23 ENCOUNTER — Inpatient Hospital Stay: Payer: Medicare Other | Attending: Oncology

## 2023-03-23 ENCOUNTER — Encounter: Payer: Self-pay | Admitting: Oncology

## 2023-03-23 VITALS — BP 108/53 | HR 69 | Temp 97.8°F | Resp 16 | Wt 112.5 lb

## 2023-03-23 DIAGNOSIS — Z87891 Personal history of nicotine dependence: Secondary | ICD-10-CM | POA: Diagnosis not present

## 2023-03-23 DIAGNOSIS — I4891 Unspecified atrial fibrillation: Secondary | ICD-10-CM | POA: Insufficient documentation

## 2023-03-23 DIAGNOSIS — D72819 Decreased white blood cell count, unspecified: Secondary | ICD-10-CM | POA: Insufficient documentation

## 2023-03-23 DIAGNOSIS — K219 Gastro-esophageal reflux disease without esophagitis: Secondary | ICD-10-CM | POA: Insufficient documentation

## 2023-03-23 DIAGNOSIS — Z86718 Personal history of other venous thrombosis and embolism: Secondary | ICD-10-CM | POA: Diagnosis not present

## 2023-03-23 DIAGNOSIS — Z79899 Other long term (current) drug therapy: Secondary | ICD-10-CM | POA: Insufficient documentation

## 2023-03-23 DIAGNOSIS — Z79890 Hormone replacement therapy (postmenopausal): Secondary | ICD-10-CM | POA: Diagnosis not present

## 2023-03-23 DIAGNOSIS — E079 Disorder of thyroid, unspecified: Secondary | ICD-10-CM | POA: Diagnosis not present

## 2023-03-23 DIAGNOSIS — D696 Thrombocytopenia, unspecified: Secondary | ICD-10-CM

## 2023-03-23 DIAGNOSIS — Z800 Family history of malignant neoplasm of digestive organs: Secondary | ICD-10-CM | POA: Diagnosis not present

## 2023-03-23 DIAGNOSIS — Z7901 Long term (current) use of anticoagulants: Secondary | ICD-10-CM | POA: Insufficient documentation

## 2023-03-23 LAB — CBC WITH DIFFERENTIAL (CANCER CENTER ONLY)
Abs Immature Granulocytes: 0.01 10*3/uL (ref 0.00–0.07)
Basophils Absolute: 0.1 10*3/uL (ref 0.0–0.1)
Basophils Relative: 1 %
Eosinophils Absolute: 0.1 10*3/uL (ref 0.0–0.5)
Eosinophils Relative: 3 %
HCT: 38.4 % (ref 36.0–46.0)
Hemoglobin: 12.4 g/dL (ref 12.0–15.0)
Immature Granulocytes: 0 %
Lymphocytes Relative: 27 %
Lymphs Abs: 1.1 10*3/uL (ref 0.7–4.0)
MCH: 29.7 pg (ref 26.0–34.0)
MCHC: 32.3 g/dL (ref 30.0–36.0)
MCV: 92.1 fL (ref 80.0–100.0)
Monocytes Absolute: 0.5 10*3/uL (ref 0.1–1.0)
Monocytes Relative: 13 %
Neutro Abs: 2.2 10*3/uL (ref 1.7–7.7)
Neutrophils Relative %: 56 %
Platelet Count: 149 10*3/uL — ABNORMAL LOW (ref 150–400)
RBC: 4.17 MIL/uL (ref 3.87–5.11)
RDW: 14.5 % (ref 11.5–15.5)
WBC Count: 4 10*3/uL (ref 4.0–10.5)
nRBC: 0 % (ref 0.0–0.2)

## 2023-03-25 ENCOUNTER — Telehealth: Payer: Self-pay

## 2023-03-25 NOTE — Telephone Encounter (Signed)
-----   Message from Dellia Beckwith, MD sent at 03/23/2023  6:31 PM EDT ----- Regarding: call Tell her WBC's and platelets all normal this time

## 2023-03-25 NOTE — Telephone Encounter (Signed)
Called patient and notified her of the lab results. 

## 2023-03-31 ENCOUNTER — Ambulatory Visit (INDEPENDENT_AMBULATORY_CARE_PROVIDER_SITE_OTHER): Payer: Medicare Other | Admitting: *Deleted

## 2023-03-31 DIAGNOSIS — L501 Idiopathic urticaria: Secondary | ICD-10-CM | POA: Diagnosis not present

## 2023-04-28 ENCOUNTER — Ambulatory Visit (INDEPENDENT_AMBULATORY_CARE_PROVIDER_SITE_OTHER): Payer: Medicare Other | Admitting: *Deleted

## 2023-04-28 DIAGNOSIS — L501 Idiopathic urticaria: Secondary | ICD-10-CM | POA: Diagnosis not present

## 2023-05-26 ENCOUNTER — Ambulatory Visit (INDEPENDENT_AMBULATORY_CARE_PROVIDER_SITE_OTHER): Payer: Medicare Other | Admitting: *Deleted

## 2023-05-26 DIAGNOSIS — L501 Idiopathic urticaria: Secondary | ICD-10-CM

## 2023-06-08 ENCOUNTER — Ambulatory Visit: Payer: Medicare Other | Admitting: Allergy and Immunology

## 2023-06-08 ENCOUNTER — Encounter: Payer: Self-pay | Admitting: Allergy and Immunology

## 2023-06-08 VITALS — BP 96/68 | HR 65 | Temp 98.0°F | Resp 12

## 2023-06-08 DIAGNOSIS — L2089 Other atopic dermatitis: Secondary | ICD-10-CM | POA: Diagnosis not present

## 2023-06-08 DIAGNOSIS — L501 Idiopathic urticaria: Secondary | ICD-10-CM | POA: Diagnosis not present

## 2023-06-08 NOTE — Progress Notes (Unsigned)
[facility] [place] [facility] [place] [facility] [place] [facility] [place]   Follow-up Note  Referring Provider: Gordan Payment., MD Primary Provider: Gordan Payment., MD Date of Office Visit: 06/08/2023  Subjective:   Heidi Raymond (DOB: [birth year]) is a 67 y.o. female [facility] in re-evaluation of the following:  HPI: Heidi Raymond returns to this clinic in evaluation of her urticaria and atopic dermatitis.  I last saw her in this clinic 20 September 2022.  She continues to have problems with inflammatory dermatosis affecting her shoulders bilaterally and occasionally her lower back.  These areas have not responded to multiple topical agents including topical steroids and calcineurin inhibitors.  She did get some relief with the topical Jak inhibitor.  She has not had any urticaria.  She continues on omalizumab.  Allergies as of 06/08/2023       Reactions   Nuprin [ibuprofen] Other (See Comments)   FOREHEAD BREAKS OUT        Medication List    ALPRAZolam 0.25 MG tablet Commonly known as: XANAX Take 0.25 mg by mouth as needed.   ascorbic acid 500 MG tablet Commonly known as: VITAMIN C Take 500 mg by mouth daily.   BENEFIBER PO Take by mouth. 1 tsp nightly   CALCIUM PO Take 500 mg by mouth as needed.   cholecalciferol 1000 units tablet Commonly known as: VITAMIN D Take 1,000 Units by mouth daily.   D3 ADULT PO Take by mouth.    diltiazem 120 MG 24 hr capsule Commonly known as: CARDIZEM CD Take 120 mg by mouth daily.   Eliquis 5 MG Tabs tablet Generic drug: apixaban Take 5 mg by mouth 2 (two) times daily.   famotidine 20 MG tablet Commonly known as: PEPCID Take 20 mg by mouth at bedtime.   levothyroxine 25 MCG tablet Commonly known as: SYNTHROID Take 25 mcg by mouth daily.   Lutein 10 MG Tabs Take 10 mg by mouth daily.   MAGNESIUM CARBONATE PO Take 1 Dose by mouth daily.   omalizumab 150 MG/ML prefilled  syringe Commonly known as: Xolair Inject 300 mg into the skin every 28 (twenty-eight) days.   omeprazole 40 MG capsule Commonly known as: PRILOSEC Take 40 mg by mouth daily.   polyethylene glycol 17 g packet Commonly known as: MIRALAX / GLYCOLAX Take 17 g by mouth daily.    Past Medical History:  Diagnosis Date   A-fib (HCC)    Anxiety    GERD (gastroesophageal reflux disease)    Thyroid disease    Urticaria     Past Surgical History:  Procedure Laterality Date   CATARACT EXTRACTION Bilateral    March/April 2024   TUMOR REMOVAL     behind ear    Review of systems negative except as noted in HPI / PMHx or noted below:  Review of Systems  Constitutional: Negative.   HENT: Negative.    Eyes: Negative.   Respiratory: Negative.    Cardiovascular: Negative.   Gastrointestinal: Negative.   Genitourinary: Negative.   Musculoskeletal: Negative.   Skin: Negative.   Neurological: Negative.   Endo/Heme/Allergies: Negative.   Psychiatric/Behavioral: Negative.       Objective:   Vitals:   06/08/23 1620  BP: 96/68  Pulse: 65  Resp: 12  Temp: 98 F (36.7 C)  SpO2: 98%          Physical Exam Constitutional:      Appearance: She is not diaphoretic.  HENT:     Head: Normocephalic.     Right Ear: Tympanic membrane, ear canal and external ear normal.     Left Ear: Tympanic membrane, ear canal and external ear normal.     Nose: Nose normal. No mucosal edema or rhinorrhea.     Mouth/Throat:     Pharynx: Uvula midline. No oropharyngeal exudate.  Eyes:     Conjunctiva/sclera: Conjunctivae normal.  Neck:     Thyroid: No thyromegaly.     Trachea: Trachea normal. No tracheal tenderness or tracheal deviation.  Cardiovascular:     Rate and Rhythm: Normal rate and regular rhythm.     Heart sounds: Normal heart sounds, S1 normal and S2 normal. No murmur heard. Pulmonary:     Effort: No respiratory distress.     Breath sounds: Normal breath sounds. No stridor. No  wheezing or rales.  Lymphadenopathy:     Head:     Right side of head: No tonsillar adenopathy.     Left side of head: No tonsillar adenopathy.     Cervical: No cervical adenopathy.  Skin:    Findings: Rash (large patch eczema bilateral shoulders approx 15 cm with erythema and scale) present. No erythema.     Nails: There is no clubbing.  Neurological:     Mental Status: She is alert.     Diagnostics: none  Assessment and Plan:   1. Idiopathic urticaria   2. Other atopic dermatitis     1. Change Xolair to Dupilumab injections   2. Continue as needed use of over-the-counter antihistamine  3. If needed:  A. Derma-Smoothe scalp oil one time per day B. Triamcinolone 0.1% cream / Opzulera   5. Plan for fall flu vaccine  6. Return to clinic in 1 year or earlier if problem   Heidi Raymond appears to have more eczema even in the face of good response with omalizumab regarding control of her urticaria.  I think we can now treat both her urticaria and her atopic dermatitis with dupilumab and we will switch her over to that agent and see what happens over the course of the next several months.  She can continue with topical steroids and topical Jak inhibitor.  [name], MD Allergy / Immunology [facility]

## 2023-06-08 NOTE — Patient Instructions (Addendum)
  1. Change Xolair to Dupilumab injections   2. Continue as needed use of over-the-counter antihistamine  3. If needed:  A. Derma-Smoothe scalp oil one time per day B. Triamcinolone 0.1% cream / Opzulera   5. Plan for fall flu vaccine  6. Return to clinic in 1 year or earlier if problem

## 2023-06-09 ENCOUNTER — Encounter: Payer: Self-pay | Admitting: Allergy and Immunology

## 2023-06-16 ENCOUNTER — Other Ambulatory Visit: Payer: Self-pay | Admitting: *Deleted

## 2023-06-16 ENCOUNTER — Other Ambulatory Visit: Payer: Self-pay

## 2023-06-16 ENCOUNTER — Other Ambulatory Visit (HOSPITAL_COMMUNITY): Payer: Self-pay

## 2023-06-16 MED ORDER — DUPIXENT 300 MG/2ML ~~LOC~~ SOSY
300.0000 mg | PREFILLED_SYRINGE | SUBCUTANEOUS | 11 refills | Status: DC
  Filled 2023-06-16 – 2023-06-20 (×2): qty 4, 28d supply, fill #0

## 2023-06-20 ENCOUNTER — Other Ambulatory Visit (HOSPITAL_COMMUNITY): Payer: Self-pay

## 2023-06-20 ENCOUNTER — Other Ambulatory Visit: Payer: Self-pay

## 2023-06-21 ENCOUNTER — Other Ambulatory Visit: Payer: Self-pay | Admitting: *Deleted

## 2023-06-21 ENCOUNTER — Other Ambulatory Visit: Payer: Self-pay

## 2023-06-21 MED ORDER — DUPIXENT 300 MG/2ML ~~LOC~~ SOSY: 600.0000 mg | PREFILLED_SYRINGE | Freq: Once | SUBCUTANEOUS | 11 refills | Status: AC

## 2023-06-21 NOTE — Telephone Encounter (Signed)
Patient advised of approval and they will reach out for delivery. Instructed patient to bring same in for admin instrux with storage and dosing instrux

## 2023-06-23 ENCOUNTER — Ambulatory Visit (INDEPENDENT_AMBULATORY_CARE_PROVIDER_SITE_OTHER): Payer: Medicare Other | Admitting: *Deleted

## 2023-06-23 ENCOUNTER — Other Ambulatory Visit (HOSPITAL_COMMUNITY): Payer: Self-pay

## 2023-06-23 ENCOUNTER — Other Ambulatory Visit: Payer: Self-pay

## 2023-06-23 DIAGNOSIS — L209 Atopic dermatitis, unspecified: Secondary | ICD-10-CM | POA: Diagnosis not present

## 2023-06-23 MED ORDER — DUPILUMAB 300 MG/2ML ~~LOC~~ SOSY
600.0000 mg | PREFILLED_SYRINGE | Freq: Once | SUBCUTANEOUS | Status: AC
Start: 2023-06-23 — End: 2023-06-23
  Administered 2023-06-23: 600 mg via SUBCUTANEOUS

## 2023-06-23 NOTE — Progress Notes (Signed)
Started Dupixent 600mg  loading dose today for atopic dermatitis. Will continue 300mg  every 2 weeks. No issues after wait time.

## 2023-07-06 ENCOUNTER — Ambulatory Visit (INDEPENDENT_AMBULATORY_CARE_PROVIDER_SITE_OTHER): Payer: Medicare Other

## 2023-07-06 DIAGNOSIS — L209 Atopic dermatitis, unspecified: Secondary | ICD-10-CM | POA: Diagnosis not present

## 2023-07-06 MED ORDER — DUPILUMAB 300 MG/2ML ~~LOC~~ SOSY
300.0000 mg | PREFILLED_SYRINGE | SUBCUTANEOUS | Status: AC
Start: 2023-07-06 — End: ?
  Administered 2023-07-06: 300 mg via SUBCUTANEOUS

## 2023-07-06 NOTE — Progress Notes (Signed)
Teaching was provided for patient to do injections at home.

## 2023-07-07 ENCOUNTER — Ambulatory Visit: Payer: Medicare Other

## 2023-08-06 ENCOUNTER — Other Ambulatory Visit: Payer: Self-pay

## 2023-09-22 NOTE — Progress Notes (Incomplete)
[facility]  [address] [place],  [place]  [address] [phone]  Clinic Day: 03/23/23  Referring physician: Gordan Payment., MD  ASSESSMENT & PLAN:  ASSESSMENT:  Leukopenia I reviewed her blood smear and it is unremarkable.  I suspect she may have cyclic neutropenia which is a benign self-limited condition usually seen in females, where the white count fluctuates up and down.  Evaluation for other causes such as multiple myeloma or nutritional deficiency has been negative.  However I cannot rule out possible early myelodysplasia, especially with her mild monocytosis of 13%.  Mild thrombocytopenia Her platelet count has improved again from 128,000 to 149,000.  History of deep venous thrombosis in 2012 This was the lower left lower extremity and she was on oral contraceptives at the time.  Plan Her WBC's are back up to 4.0 again, consistent with the diagnosis of cyclic neutropenia.  None of her medications are likely to cause this and her vitamin levels were normal.  She has no monoclonal spike.  It is possible this represents an early myelodysplasia, especially since she has 2 cell lines affected, and a mild monocytosis of 13%.  I have recommended that we simply monitor her counts and so I will see her back in 6 months with repeat CBC.  I discussed the assessment and treatment plan with the patient.  The patient was provided an opportunity to ask questions and all were answered.  The patient agreed with the plan and demonstrated an understanding of the instructions.  The patient was advised to call back if the symptoms worsen or if the condition fails to improve as anticipated.  I provided 15 minutes of face-to-face time during this this encounter and > 50% was spent counseling as documented under my assessment and plan.   Dellia Beckwith, MD [facility] [address] [place]  [place] [address] Dept: [phone] Dept Fax: [phone]   CHIEF COMPLAINT:  CC: Pancytopenia  Current Treatment: Evaluation   HISTORY OF PRESENT ILLNESS:  Heidi Raymond is a 67 y.o. female with a history of pancytopenia who is referred in consultation with Dr. Feliciana Rossetti or assessment and management.  The main abnormality has been a low white count and she tells me this has been gradually decreasing since 2015.  I just have the most recent CBC which was on June 17, 2022.  Her white count was 3.0, hemoglobin 13.5 with an MCV of 91.3 and platelet count of 128,000.  When I last rechecked her blood counts in September, her white count was up to 3.9 and her platelet count to 139,000 with a normal hemoglobin.  My conclusion was that this likely represents cyclic neutropenia. She denies report infections, weight loss, fevers, chills or night sweats.  She denies any liver disease.  She denies severe bone pain or severe arthritis.  There is no family history of blood dyscrasias.  INTERVAL HISTORY:  [name] is seen in the clinic for follow up of her leukopenia and mild thrombocytopenia. Patient states that she feels *** and ***.     She denies signs of infection such as sore throat, sinus drainage, cough, or urinary symptoms.  She denies fevers or recurrent chills. She denies pain. She denies nausea, vomiting, chest pain, dyspnea or cough. Her appetite is *** and her weight {Weight change:10426}.  Patient states that she feels fair but has had a lot going on with her family. Her mother expired  in November and her father is 62 years old, with failing memory.  Now her sister has pneumonia. She has no changes in her medications. She denies signs of infection such as sore throat, sinus drainage, cough, or urinary symptoms.  She denies fevers or recurrent chills. She denies pain. She denies nausea, vomiting, chest pain, dyspnea or cough. Her appetite is good, but her weight has decreased 7 pounds over last 6  months .  She has a gradually decreasing white count since 2015, but it fluctuates up and down.  She has many comorbidities and many medications.  Vitamin levels were normal and serum protein electrophoresis was negative for monoclonal spike.  She does not have excessive bruising or bleeding.  It is possible that this represents early myelodysplasia since 2 cell lines are affected and she does have a mild monocytosis.  I do not feel we need to pursue a bone marrow since her blood counts are just mildly affected.  I will monitor periodically. Her WBC's today are back up to 4.0 from 3.2 last time and platelets also improved, from 128,000 to 149,000.  HISTORY:   Past Medical History:  Diagnosis Date   A-fib (HCC)    Anxiety    GERD (gastroesophageal reflux disease)    Thyroid disease    Urticaria   Deep venous thrombosis in October 2012 of the left lower extremity (she was on oral contraceptives at that time)   Past Surgical History:  Procedure Laterality Date   CATARACT EXTRACTION Bilateral    March/April 2024   TUMOR REMOVAL     behind ear    Family History  Problem Relation Age of Onset   Allergic rhinitis Neg Hx    Angioedema Neg Hx    Asthma Neg Hx    Atopy Neg Hx    Eczema Neg Hx    Immunodeficiency Neg Hx    Urticaria Neg Hx   Her maternal aunt had colon cancer at age 23 A maternal great grandmother had breast cancer.  Social History:  reports that she has quit smoking. Her smoking use included cigarettes. She has never used smokeless tobacco. She reports that she does not currently use alcohol. She reports that she does not use drugs.The patient is alone today.  She is married and has 2 stepsons.  Allergies:  Allergies  Allergen Reactions   Nuprin [Ibuprofen] Other (See Comments)    FOREHEAD BREAKS OUT    Current Medications: Current Outpatient Medications  Medication Sig Dispense Refill   ALPRAZolam (XANAX) 0.25 MG tablet Take 0.25 mg by mouth as needed.      CALCIUM PO Take 500 mg by mouth as needed.     Cholecalciferol (D3 ADULT PO) Take by mouth.     cholecalciferol (VITAMIN D) 1000 UNITS tablet Take 1,000 Units by mouth daily.     diltiazem (CARDIZEM CD) 120 MG 24 hr capsule Take 120 mg by mouth daily.     ELIQUIS 5 MG TABS tablet Take 5 mg by mouth 2 (two) times daily.     famotidine (PEPCID) 20 MG tablet Take 20 mg by mouth at bedtime. (Patient not taking: Reported on 06/08/2023)     levothyroxine (SYNTHROID) 25 MCG tablet Take 25 mcg by mouth daily.     Lutein 10 MG TABS Take 10 mg by mouth daily.     MAGNESIUM CARBONATE PO Take 1 Dose by mouth daily.     omeprazole (PRILOSEC) 40 MG capsule Take 40 mg by mouth daily. (Patient not  taking: Reported on 06/08/2023)     polyethylene glycol (MIRALAX / GLYCOLAX) 17 g packet Take 17 g by mouth daily.     vitamin C (ASCORBIC ACID) 500 MG tablet Take 500 mg by mouth daily.     Wheat Dextrin (BENEFIBER PO) Take by mouth. 1 tsp nightly     Current Facility-Administered Medications  Medication Dose Route Frequency Provider Last Rate Last Admin   dupilumab (DUPIXENT) prefilled syringe 300 mg  300 mg Subcutaneous Q14 Days Kozlow, Alvira Philips, MD   300 mg at 07/06/23 1448    REVIEW OF SYSTEMS:  Review of Systems  Constitutional: Negative.  Negative for appetite change, chills, diaphoresis, fatigue, fever and unexpected weight change.  HENT:  Negative.  Negative for hearing loss, lump/mass, mouth sores, nosebleeds, sore throat, tinnitus, trouble swallowing and voice change.        She was seen by an ENT doctor in [facility] for hoarseness.  Eyes: Negative.  Negative for eye problems and icterus.  Respiratory: Negative.  Negative for chest tightness, cough, hemoptysis, shortness of breath and wheezing.   Cardiovascular: Negative.  Negative for chest pain, leg swelling and palpitations.  Gastrointestinal: Negative.  Negative for abdominal distention, abdominal pain, blood in stool, constipation, diarrhea, nausea,  rectal pain and vomiting.  Endocrine: Positive for hot flashes.  Genitourinary: Negative.  Negative for bladder incontinence, difficulty urinating, dyspareunia, dysuria, frequency, hematuria, menstrual problem, nocturia, pelvic pain, vaginal bleeding and vaginal discharge.   Musculoskeletal: Negative.  Negative for arthralgias, back pain, flank pain, gait problem, myalgias, neck pain and neck stiffness.  Skin:  Positive for itching. Negative for rash and wound.       Urticaria, being treated by Dr. Lucie Leather with Xolair  Neurological: Negative.  Negative for dizziness, extremity weakness, gait problem, headaches, light-headedness, numbness, seizures and speech difficulty.  Hematological: Negative.  Negative for adenopathy. Does not bruise/bleed easily.  Psychiatric/Behavioral:  Negative for confusion, decreased concentration, depression, sleep disturbance and suicidal ideas. The patient is nervous/anxious.     VITALS:  There were no vitals taken for this visit.  Wt Readings from Last 3 Encounters:  03/23/23 112 lb 8 oz (51 kg)  09/15/22 119 lb 4.8 oz (54.1 kg)  07/16/22 118 lb 1.6 oz (53.6 kg)    There is no height or weight on file to calculate BMI.  Performance status (ECOG): 0 - Asymptomatic  PHYSICAL EXAM:  Physical Exam Vitals and nursing note reviewed. Exam conducted with a chaperone present.  Constitutional:      General: She is not in acute distress.    Appearance: Normal appearance. She is normal weight. She is not ill-appearing, toxic-appearing or diaphoretic.  HENT:     Head: Normocephalic and atraumatic.     Right Ear: Tympanic membrane, ear canal and external ear normal. There is no impacted cerumen.     Left Ear: Tympanic membrane, ear canal and external ear normal. There is no impacted cerumen.     Nose: Nose normal. No congestion or rhinorrhea.     Mouth/Throat:     Mouth: Mucous membranes are moist.     Pharynx: Oropharynx is clear. No oropharyngeal exudate or  posterior oropharyngeal erythema.  Eyes:     General: No scleral icterus.       Right eye: No discharge.        Left eye: No discharge.     Extraocular Movements: Extraocular movements intact.     Conjunctiva/sclera: Conjunctivae normal.     Pupils: Pupils are equal,  round, and reactive to light.  Neck:     Vascular: No carotid bruit.  Cardiovascular:     Rate and Rhythm: Normal rate and regular rhythm.     Pulses: Normal pulses.     Heart sounds: Normal heart sounds. No murmur heard.    No friction rub. No gallop.  Pulmonary:     Effort: Pulmonary effort is normal. No respiratory distress.     Breath sounds: Normal breath sounds. No stridor. No wheezing, rhonchi or rales.  Chest:     Chest wall: No tenderness.  Abdominal:     General: Bowel sounds are normal. There is no distension.     Palpations: Abdomen is soft. There is no hepatomegaly, splenomegaly or mass.     Tenderness: There is no abdominal tenderness. There is no right CVA tenderness, left CVA tenderness, guarding or rebound.     Hernia: No hernia is present.  Musculoskeletal:        General: No swelling, tenderness, deformity or signs of injury. Normal range of motion.     Cervical back: Normal range of motion and neck supple. No rigidity or tenderness.     Right lower leg: No edema.     Left lower leg: No edema.  Lymphadenopathy:     Cervical: No cervical adenopathy.     Right cervical: No superficial, deep or posterior cervical adenopathy.    Left cervical: No superficial, deep or posterior cervical adenopathy.     Upper Body:     Right upper body: No supraclavicular, axillary or pectoral adenopathy.     Left upper body: No supraclavicular, axillary or pectoral adenopathy.  Skin:    General: Skin is warm and dry.     Coloration: Skin is not jaundiced or pale.     Findings: No bruising, erythema, lesion or rash.  Neurological:     General: No focal deficit present.     Mental Status: She is alert and oriented  to person, place, and time. Mental status is at baseline.     Cranial Nerves: No cranial nerve deficit.     Sensory: No sensory deficit.     Motor: No weakness.     Coordination: Coordination normal.     Gait: Gait normal.     Deep Tendon Reflexes: Reflexes normal.  Psychiatric:        Mood and Affect: Mood normal.        Behavior: Behavior normal.        Thought Content: Thought content normal.        Judgment: Judgment normal.    LABS:   Component Ref Range & Units 06/21/2023  WBC 4.40 - 11.00 10*3/uL 3.30 Low   RBC 4.10 - 5.10 10*6/uL 4.29  Hemoglobin 12.3 - 15.3 g/dL 02.7  Hematocrit 25.3 - 44.6 % 38.8  Mean Corpuscular Volume (MCV) 80.0 - 96.0 fL 90.5  Mean Corpuscular Hemoglobin (MCH) 27.5 - 33.2 pg 30.6  Mean Corpuscular Hemoglobin Conc (MCHC) 33.0 - 37.0 g/dL 66.4  Red Cell Distribution Width (RDW) 12.3 - 17.0 % 14.6  Platelet Count (PLT) 150 - 450 10*3/uL 150   Component Ref Range & Units 06/21/2023  Sodium 136 - 145 mmol/L 141  Potassium 3.5 - 5.1 mmol/L 6.1 High Panic   Chloride 98 - 107 mmol/L 103  CO2 21 - 31 mmol/L 33 High   Anion Gap 6 - 14 mmol/L 5 Low   Glucose, Random 70 - 99 mg/dL 81  Blood Urea Nitrogen (BUN) 7 -  25 mg/dL 22  Creatinine 3.66 - 4.40 mg/dL 3.47  eGFR >42 VZ/DGL/8.75I4 71  Albumin 3.5 - 5.7 g/dL 4.2  Total Protein 6.4 - 8.9 g/dL 6.6  Bilirubin, Total 0.3 - 1.0 mg/dL 0.5  Alkaline Phosphatase (ALP) 34 - 104 U/L 90  Aspartate Aminotransferase (AST) 13 - 39 U/L 27  Alanine Aminotransferase (ALT) 7 - 52 U/L 26  Calcium 8.6 - 10.3 mg/dL 9.6   Component Ref Range & Units 06/22/2023  Sodium 136 - 145 mmol/L 138  Potassium 3.5 - 5.1 mmol/L 4.2  Chloride 98 - 107 mmol/L 101  CO2 21 - 31 mmol/L 32 High   Anion Gap 6 - 14 mmol/L 5 Low   Glucose, Random 70 - 99 mg/dL 77  Blood Urea Nitrogen (BUN) 7 - 25 mg/dL 19  Creatinine 3.32 - 9.51 mg/dL 8.84  eGFR >16 SA/YTK/1.60F0 82  Calcium 8.6 - 10.3 mg/dL 9.2    Component Ref Range & Units 06/22/2023  TSH 0.450 - 5.330 uIU/mL 1.692   Component Ref Range & Units 06/21/2023  Cholesterol, Total, Lipid Panel <200 mg/dL 932  Triglycerides, Lipid Panel <150 mg/dL 37  HDL Cholesterol - Lipid Panel >=60 mg/dL 78  LDL Cholesterol, Calculated <100 mg/dL 355 High   Non-HDL Cholesterol mg/dL 732      Latest Ref Rng & Units 03/23/2023    2:26 PM 09/15/2022   12:00 AM 07/16/2022   12:00 AM  CBC  WBC 4.0 - 10.5 K/uL 4.0  3.2     3.9      Hemoglobin 12.0 - 15.0 g/dL 20.2  54.2     70.6      Hematocrit 36.0 - 46.0 % 38.4  37     37      Platelets 150 - 400 K/uL 149  128     139         This result is from an external source.      Latest Ref Rng & Units 07/16/2022   12:00 AM  CMP  BUN 4 - 21 20      Creatinine 0.5 - 1.1 0.8      Sodium 137 - 147 140      Potassium 3.5 - 5.1 mEq/L 4.3      Chloride 99 - 108 102      CO2 13 - 22 35      Calcium 8.7 - 10.7 9.6      Alkaline Phos 25 - 125 94      AST 13 - 35 39      ALT 7 - 35 U/L 36         This result is from an external source.   No results found for: "CEA1", "CEA" / No results found for: "CEA1", "CEA" No results found for: "PSA1" No results found for: "CBJ628" No results found for: "CAN125"  Lab Results  Component Value Date   TOTALPROTELP 6.3 07/16/2022   ALBUMINELP 3.7 07/16/2022   A1GS 0.2 07/16/2022   A2GS 0.6 07/16/2022   BETS 0.8 07/16/2022   GAMS 1.0 07/16/2022   MSPIKE Not Observed 07/16/2022   SPEI Comment 07/16/2022   No results found for: "TIBC", "FERRITIN", "IRONPCTSAT" No results found for: "LDH"  STUDIES:  No results found.     I,Jasmine M Lassiter,acting as a scribe for Dellia Beckwith, MD.,have documented all relevant documentation on the behalf of Dellia Beckwith, MD,as directed by  Dellia Beckwith, MD while in the presence of Gardiner Fanti  Gilman Buttner, MD.

## 2023-09-27 ENCOUNTER — Ambulatory Visit: Payer: Medicare Other | Admitting: Oncology

## 2023-09-27 ENCOUNTER — Other Ambulatory Visit: Payer: Medicare Other

## 2023-10-05 ENCOUNTER — Inpatient Hospital Stay: Payer: Medicare Other

## 2023-10-05 ENCOUNTER — Inpatient Hospital Stay: Payer: Medicare Other | Admitting: Oncology

## 2023-11-16 ENCOUNTER — Other Ambulatory Visit: Payer: Self-pay | Admitting: *Deleted

## 2023-11-16 MED ORDER — DUPIXENT 300 MG/2ML ~~LOC~~ SOSY: 300.0000 mg | PREFILLED_SYRINGE | SUBCUTANEOUS | 11 refills | Status: DC

## 2023-11-22 ENCOUNTER — Encounter: Payer: Self-pay | Admitting: Oncology

## 2023-11-22 ENCOUNTER — Inpatient Hospital Stay: Payer: Medicare Other | Admitting: Oncology

## 2023-11-22 ENCOUNTER — Inpatient Hospital Stay: Payer: Medicare Other | Attending: Oncology

## 2023-11-22 VITALS — BP 107/49 | HR 58 | Temp 97.4°F | Resp 18 | Ht 63.5 in | Wt 113.9 lb

## 2023-11-22 DIAGNOSIS — Z86718 Personal history of other venous thrombosis and embolism: Secondary | ICD-10-CM | POA: Insufficient documentation

## 2023-11-22 DIAGNOSIS — Z79890 Hormone replacement therapy (postmenopausal): Secondary | ICD-10-CM | POA: Insufficient documentation

## 2023-11-22 DIAGNOSIS — Z87891 Personal history of nicotine dependence: Secondary | ICD-10-CM | POA: Insufficient documentation

## 2023-11-22 DIAGNOSIS — Z79899 Other long term (current) drug therapy: Secondary | ICD-10-CM | POA: Diagnosis not present

## 2023-11-22 DIAGNOSIS — Z803 Family history of malignant neoplasm of breast: Secondary | ICD-10-CM | POA: Diagnosis not present

## 2023-11-22 DIAGNOSIS — K219 Gastro-esophageal reflux disease without esophagitis: Secondary | ICD-10-CM | POA: Diagnosis not present

## 2023-11-22 DIAGNOSIS — Z800 Family history of malignant neoplasm of digestive organs: Secondary | ICD-10-CM | POA: Insufficient documentation

## 2023-11-22 DIAGNOSIS — I4891 Unspecified atrial fibrillation: Secondary | ICD-10-CM | POA: Insufficient documentation

## 2023-11-22 DIAGNOSIS — E079 Disorder of thyroid, unspecified: Secondary | ICD-10-CM | POA: Insufficient documentation

## 2023-11-22 DIAGNOSIS — Z7901 Long term (current) use of anticoagulants: Secondary | ICD-10-CM | POA: Diagnosis not present

## 2023-11-22 DIAGNOSIS — D696 Thrombocytopenia, unspecified: Secondary | ICD-10-CM

## 2023-11-22 DIAGNOSIS — D72819 Decreased white blood cell count, unspecified: Secondary | ICD-10-CM | POA: Diagnosis present

## 2023-11-22 DIAGNOSIS — L509 Urticaria, unspecified: Secondary | ICD-10-CM | POA: Diagnosis not present

## 2023-11-22 LAB — CBC WITH DIFFERENTIAL (CANCER CENTER ONLY)
Abs Immature Granulocytes: 0.01 10*3/uL (ref 0.00–0.07)
Basophils Absolute: 0 10*3/uL (ref 0.0–0.1)
Basophils Relative: 1 %
Eosinophils Absolute: 0.1 10*3/uL (ref 0.0–0.5)
Eosinophils Relative: 1 %
HCT: 37.1 % (ref 36.0–46.0)
Hemoglobin: 12.6 g/dL (ref 12.0–15.0)
Immature Granulocytes: 0 %
Lymphocytes Relative: 21 %
Lymphs Abs: 0.9 10*3/uL (ref 0.7–4.0)
MCH: 31.3 pg (ref 26.0–34.0)
MCHC: 34 g/dL (ref 30.0–36.0)
MCV: 92.3 fL (ref 80.0–100.0)
Monocytes Absolute: 0.5 10*3/uL (ref 0.1–1.0)
Monocytes Relative: 11 %
Neutro Abs: 3 10*3/uL (ref 1.7–7.7)
Neutrophils Relative %: 66 %
Platelet Count: 141 10*3/uL — ABNORMAL LOW (ref 150–400)
RBC: 4.02 MIL/uL (ref 3.87–5.11)
RDW: 14.4 % (ref 11.5–15.5)
WBC Count: 4.5 10*3/uL (ref 4.0–10.5)
nRBC: 0 % (ref 0.0–0.2)
nRBC: 0 /100{WBCs}

## 2023-11-22 NOTE — Progress Notes (Signed)
 Heidi Raymond [facility]  [address] [place],  [place]  [address] [phone]  Clinic Day: 11/25/23  Referring physician: Thurmond Raymond LABOR., MD  ASSESSMENT & PLAN:  ASSESSMENT:  Leukopenia I reviewed her blood smear and it is unremarkable.  I suspect she may have cyclic neutropenia which is a benign self-limited condition usually seen in females, where the white count fluctuates up and down.  Evaluation for other causes such as multiple myeloma or nutritional deficiency has been negative.  However I cannot rule out possible early myelodysplasia, especially with her mild monocytosis.  Mild thrombocytopenia Her platelet count has improved again from 128,000 to 149,000. This is now down to 141,000. I really have not found a etiology for this. Her vitamin levels are good and SPEP is negative for M-spike. This remains mild and she shows no other evidence of hematologic disease. I will simply see her back as needed.   History of deep venous thrombosis in 2012 This was the lower left lower extremity and she was on oral contraceptives at the time.  Plan: She has a WBC of 4.5, hemoglobin of 12.6, and platelet count of 141,000. I informed her that Dr. Thurmond can take over seeing her and cihecking routine labs. I informed her if her WBC does drop low, then she is welcome to come back or if the platelets dropped below 100,000. I will see her back as needed. I discussed the assessment and treatment plan with the patient and her husband.  They were provided an opportunity to ask questions and all were answered.  The patient and her husband agreed with the plan and demonstrated an understanding of the instructions.  The patient was advised to call back if the symptoms worsen or if the condition fails to improve as anticipated.  I provided 11 minutes of face-to-face time during this this encounter and > 50% was spent counseling as documented under my assessment and plan.  Heidi VEAR Cornish, MD [facility] AT [facility] [address] [place] [address] Dept: [phone] Dept Fax: [phone]   CHIEF COMPLAINT:  CC: Pancytopenia  Current Treatment: Evaluation  HISTORY OF PRESENT ILLNESS:  Heidi Raymond is a 68 y.o. female with a history of pancytopenia who is referred in consultation with Dr. Cathlyn Heidi or assessment and management.  The main abnormality has been a low white count and she tells me this has been gradually decreasing since 2015.  I just have the most recent CBC which was on June 17, 2022.  Her white count was 3.0, hemoglobin 13.5 with an MCV of 91.3 and platelet count of 128,000.  When I last rechecked her blood counts in September, her white count was up to 3.9 and her platelet count to 139,000 with a normal hemoglobin.  My conclusion was that this could be cyclic neutropenia. She denies report infections, weight loss, fevers, chills or night sweats.  She denies any liver disease.  She denies severe bone pain or severe arthritis.  There is no family history of blood dyscrasias.  INTERVAL HISTORY:  Heidi Raymond is seen in the clinic for follow up of her leukopenia and mild thrombocytopenia. Patient states that she feels well and has no complaints of pain. She continues to have easy bruising but no other abnormal symptoms. She had 1 fall since her last office visit due to loss of balance but is well. She has a WBC of 4.5, hemoglobin of 12.6, and platelet count  of 141,000. I informed her that Dr. Thurmond can take over seeing her and checking routine labs. I informed her if her WBC does drop low, then she is welcome to come back. I will see her back as needed.  She denies signs of infection such as sore throat, sinus drainage, cough, or urinary symptoms.  She denies fevers or recurrent chills. She denies pain. She denies nausea, vomiting, chest pain, dyspnea or cough. Her appetite is good and her weight has  increased 1 pounds over last 8 months . She is accompanied at today's visit with her husband.    HISTORY:   Past Medical History:  Diagnosis Date   A-fib (HCC)    Anxiety    GERD (gastroesophageal reflux disease)    Thyroid  disease    Urticaria   Deep venous thrombosis in October 2012 of the left lower extremity (she was on oral contraceptives at that time)   Past Surgical History:  Procedure Laterality Date   CATARACT EXTRACTION Bilateral    March/April 2024   TUMOR REMOVAL     behind ear    Family History  Problem Relation Age of Onset   Allergic rhinitis Neg Hx    Angioedema Neg Hx    Asthma Neg Hx    Atopy Neg Hx    Eczema Neg Hx    Immunodeficiency Neg Hx    Urticaria Neg Hx   Her maternal aunt had colon cancer at age 37 A maternal great grandmother had breast cancer.  Social History:  reports that she has quit smoking. Her smoking use included cigarettes. She has never used smokeless tobacco. She reports that she does not currently use alcohol. She reports that she does not use drugs.The patient is alone today.  She is married and has 2 stepsons.  Allergies:  Allergies  Allergen Reactions   Nuprin [Ibuprofen] Other (See Comments)    FOREHEAD BREAKS OUT    Current Medications: Current Outpatient Medications  Medication Sig Dispense Refill   ALPRAZolam (XANAX) 0.25 MG tablet Take 0.25 mg by mouth as needed.     CALCIUM PO Take 500 mg by mouth as needed.     Cholecalciferol (D3 ADULT PO) Take by mouth.     cholecalciferol (VITAMIN D) 1000 UNITS tablet Take 1,000 Units by mouth daily.     diltiazem (CARDIZEM CD) 120 MG 24 hr capsule Take 120 mg by mouth daily.     dupilumab  (DUPIXENT ) 300 MG/2ML prefilled syringe Inject 300 mg into the skin every 28 (twenty-eight) days. 4 mL 11   ELIQUIS 5 MG TABS tablet Take 5 mg by mouth 2 (two) times daily.     famotidine (PEPCID) 20 MG tablet Take 20 mg by mouth at bedtime. (Patient not taking: Reported on 06/08/2023)      levothyroxine (SYNTHROID) 25 MCG tablet Take 25 mcg by mouth daily.     Lutein 10 MG TABS Take 10 mg by mouth daily.     MAGNESIUM CARBONATE PO Take 1 Dose by mouth daily.     omeprazole (PRILOSEC) 40 MG capsule Take 40 mg by mouth daily. (Patient not taking: Reported on 06/08/2023)     polyethylene glycol (MIRALAX / GLYCOLAX) 17 g packet Take 17 g by mouth daily.     vitamin C (ASCORBIC ACID) 500 MG tablet Take 500 mg by mouth daily.     Wheat Dextrin (BENEFIBER PO) Take by mouth. 1 tsp nightly     Current Facility-Administered Medications  Medication Dose Route Frequency Provider  Last Rate Last Admin   dupilumab  (DUPIXENT ) prefilled syringe 300 mg  300 mg Subcutaneous Q14 Days Kozlow, Eric J, MD   300 mg at 07/06/23 1448    REVIEW OF SYSTEMS:  Review of Systems  Constitutional: Negative.  Negative for appetite change, chills, diaphoresis, fatigue, fever and unexpected weight change.  HENT:  Negative.  Negative for hearing loss, lump/mass, mouth sores, nosebleeds, sore throat, tinnitus, trouble swallowing and voice change.   Eyes: Negative.  Negative for eye problems and icterus.  Respiratory: Negative.  Negative for chest tightness, cough, hemoptysis, shortness of breath and wheezing.   Cardiovascular: Negative.  Negative for chest pain, leg swelling and palpitations.  Gastrointestinal: Negative.  Negative for abdominal distention, abdominal pain, blood in stool, constipation, diarrhea, nausea, rectal pain and vomiting.  Endocrine: Negative for hot flashes.  Genitourinary: Negative.  Negative for bladder incontinence, difficulty urinating, dyspareunia, dysuria, frequency, hematuria, menstrual problem, nocturia, pelvic pain, vaginal bleeding and vaginal discharge.   Musculoskeletal: Negative.  Negative for arthralgias, back pain, flank pain, gait problem, myalgias, neck pain and neck stiffness.  Skin:  Negative for itching, rash and wound.  Neurological: Negative.  Negative for dizziness,  extremity weakness, gait problem, headaches, light-headedness, numbness, seizures and speech difficulty.  Hematological:  Negative for adenopathy. Bruises/bleeds easily.  Psychiatric/Behavioral:  Negative for confusion, decreased concentration, depression, sleep disturbance and suicidal ideas. The patient is nervous/anxious.     VITALS:  Blood pressure (!) 107/49, pulse (!) 58, temperature (!) 97.4 F (36.3 C), temperature source Oral, resp. rate 18, height 5' 3.5 (1.613 m), weight 113 lb 14.4 oz (51.7 kg), SpO2 99%.  Wt Readings from Last 3 Encounters:  11/22/23 113 lb 14.4 oz (51.7 kg)  03/23/23 112 lb 8 oz (51 kg)  09/15/22 119 lb 4.8 oz (54.1 kg)    Body mass index is 19.86 kg/m.  Performance status (ECOG): 0 - Asymptomatic  PHYSICAL EXAM:  Physical Exam Vitals and nursing note reviewed. Exam conducted with a chaperone present.  Constitutional:      General: She is not in acute distress.    Appearance: Normal appearance. She is normal weight. She is not ill-appearing, toxic-appearing or diaphoretic.  HENT:     Head: Normocephalic and atraumatic.     Right Ear: Tympanic membrane, ear canal and external ear normal. There is no impacted cerumen.     Left Ear: Tympanic membrane, ear canal and external ear normal. There is no impacted cerumen.     Nose: Nose normal. No congestion or rhinorrhea.     Mouth/Throat:     Mouth: Mucous membranes are moist.     Pharynx: Oropharynx is clear. No oropharyngeal exudate or posterior oropharyngeal erythema.  Eyes:     General: No scleral icterus.       Right eye: No discharge.        Left eye: No discharge.     Extraocular Movements: Extraocular movements intact.     Conjunctiva/sclera: Conjunctivae normal.     Pupils: Pupils are equal, round, and reactive to light.  Neck:     Vascular: No carotid bruit.  Cardiovascular:     Rate and Rhythm: Normal rate and regular rhythm.     Pulses: Normal pulses.     Heart sounds: Normal heart  sounds. No murmur heard.    No friction rub. No gallop.  Pulmonary:     Effort: Pulmonary effort is normal. No respiratory distress.     Breath sounds: Normal breath sounds. No stridor. No wheezing,  rhonchi or rales.  Chest:     Chest wall: No tenderness.  Abdominal:     General: Bowel sounds are normal. There is no distension.     Palpations: Abdomen is soft. There is no hepatomegaly, splenomegaly or mass.     Tenderness: There is no abdominal tenderness. There is no right CVA tenderness, left CVA tenderness, guarding or rebound.     Hernia: No hernia is present.  Musculoskeletal:        General: No swelling, tenderness, deformity or signs of injury. Normal range of motion.     Cervical back: Normal range of motion and neck supple. No rigidity or tenderness.     Right lower leg: No edema.     Left lower leg: No edema.  Lymphadenopathy:     Cervical: No cervical adenopathy.     Right cervical: No superficial, deep or posterior cervical adenopathy.    Left cervical: No superficial, deep or posterior cervical adenopathy.     Upper Body:     Right upper body: No supraclavicular, axillary or pectoral adenopathy.     Left upper body: No supraclavicular, axillary or pectoral adenopathy.  Skin:    General: Skin is warm and dry.     Coloration: Skin is not jaundiced or pale.     Findings: No bruising, erythema, lesion or rash.  Neurological:     General: No focal deficit present.     Mental Status: She is alert and oriented to person, place, and time. Mental status is at baseline.     Cranial Nerves: No cranial nerve deficit.     Sensory: No sensory deficit.     Motor: No weakness.     Coordination: Coordination normal.     Gait: Gait normal.     Deep Tendon Reflexes: Reflexes normal.  Psychiatric:        Mood and Affect: Mood normal.        Behavior: Behavior normal.        Thought Content: Thought content normal.        Judgment: Judgment normal.    LABS:      Latest Ref Rng  & Units 11/22/2023    2:38 PM 03/23/2023    2:26 PM 09/15/2022   12:00 AM  CBC  WBC 4.0 - 10.5 K/uL 4.5  4.0  3.2      Hemoglobin 12.0 - 15.0 g/dL 87.3  87.5  87.5      Hematocrit 36.0 - 46.0 % 37.1  38.4  37      Platelets 150 - 400 K/uL 141  149  128         This result is from an external source.      Latest Ref Rng & Units 07/16/2022   12:00 AM  CMP  BUN 4 - 21 20      Creatinine 0.5 - 1.1 0.8      Sodium 137 - 147 140      Potassium 3.5 - 5.1 mEq/L 4.3      Chloride 99 - 108 102      CO2 13 - 22 35      Calcium 8.7 - 10.7 9.6      Alkaline Phos 25 - 125 94      AST 13 - 35 39      ALT 7 - 35 U/L 36         This result is from an external source.   No results found for: CEA1,  CEA / No results found for: CEA1, CEA No results found for: PSA1 No results found for: CAN199 No results found for: RJW874  Lab Results  Component Value Date   TOTALPROTELP 6.3 07/16/2022   ALBUMINELP 3.7 07/16/2022   A1GS 0.2 07/16/2022   A2GS 0.6 07/16/2022   BETS 0.8 07/16/2022   GAMS 1.0 07/16/2022   MSPIKE Not Observed 07/16/2022   SPEI Comment 07/16/2022   No results found for: TIBC, FERRITIN, IRONPCTSAT No results found for: LDH  STUDIES:  No results found.     I,Jasmine M Lassiter,acting as a scribe for Heidi VEAR Cornish, MD.,have documented all relevant documentation on the behalf of Heidi VEAR Cornish, MD,as directed by  Heidi VEAR Cornish, MD while in the presence of Heidi VEAR Cornish, MD.

## 2023-11-22 NOTE — Progress Notes (Deleted)
 [facility]  [address] [place],  [place]  [address] [phone]  Clinic Day: 03/23/23  Referring physician: Thurmond Cathlyn LABOR., MD  ASSESSMENT & PLAN:  ASSESSMENT:  Leukopenia I reviewed her blood smear and it is unremarkable.  I suspect she may have cyclic neutropenia which is a benign self-limited condition usually seen in females, where the white count fluctuates up and down.  Evaluation for other causes such as multiple myeloma or nutritional deficiency has been negative.  However I cannot rule out possible early myelodysplasia, especially with her mild monocytosis of 13%.  Mild thrombocytopenia Her platelet count has improved again from 128,000 to 149,000.  History of deep venous thrombosis in 2012 This was the lower left lower extremity and she was on oral contraceptives at the time.  Plan Her WBC's are back up to 4.0 again, consistent with the diagnosis of cyclic neutropenia.  None of her medications are likely to cause this and her vitamin levels were normal.  She has no monoclonal spike.  It is possible this represents an early myelodysplasia, especially since she has 2 cell lines affected, and a mild monocytosis of 13%.  I have recommended that we simply monitor her counts and so I will see her back in 6 months with repeat CBC.  I discussed the assessment and treatment plan with the patient.  The patient was provided an opportunity to ask questions and all were answered.  The patient agreed with the plan and demonstrated an understanding of the instructions.  The patient was advised to call back if the symptoms worsen or if the condition fails to improve as anticipated.  I provided 15 minutes of face-to-face time during this this encounter and > 50% was spent counseling as documented under my assessment and plan.   Heidi VEAR Cornish, MD [facility] [address] [place]  [place] [address] Dept: [phone] Dept Fax: [phone]   CHIEF COMPLAINT:  CC: Pancytopenia  Current Treatment: Evaluation   HISTORY OF PRESENT ILLNESS:  Heidi Raymond is a 68 y.o. female with a history of pancytopenia who is referred in consultation with Dr. Cathlyn Thurmond or assessment and management.  The main abnormality has been a low white count and she tells me this has been gradually decreasing since 2015.  I just have the most recent CBC which was on June 17, 2022.  Her white count was 3.0, hemoglobin 13.5 with an MCV of 91.3 and platelet count of 128,000.  When I last rechecked her blood counts in September, her white count was up to 3.9 and her platelet count to 139,000 with a normal hemoglobin.  My conclusion was that this likely represents cyclic neutropenia. She denies report infections, weight loss, fevers, chills or night sweats.  She denies any liver disease.  She denies severe bone pain or severe arthritis.  There is no family history of blood dyscrasias.  INTERVAL HISTORY:  Heidi Raymond is seen in the clinic for follow up of her leukopenia and mild thrombocytopenia. Patient states that she feels *** and ***.     She denies signs of infection such as sore throat, sinus drainage, cough, or urinary symptoms.  She denies fevers or recurrent chills. She denies pain. She denies nausea, vomiting, chest pain, dyspnea or cough. Her appetite is *** and her weight {Weight change:10426}.  Patient states that she feels fair but has had a lot going on with her family. Her mother expired  in November and her father is 20 years old, with failing memory.  Now her sister has pneumonia. She has no changes in her medications. She denies signs of infection such as sore throat, sinus drainage, cough, or urinary symptoms.  She denies fevers or recurrent chills. She denies pain. She denies nausea, vomiting, chest pain, dyspnea or cough. Her appetite is good, but her weight has decreased 7 pounds over last 6  months .  She has a gradually decreasing white count since 2015, but it fluctuates up and down.  She has many comorbidities and many medications.  Vitamin levels were normal and serum protein electrophoresis was negative for monoclonal spike.  She does not have excessive bruising or bleeding.  It is possible that this represents early myelodysplasia since 2 cell lines are affected and she does have a mild monocytosis.  I do not feel we need to pursue a bone marrow since her blood counts are just mildly affected.  I will monitor periodically. Her WBC's today are back up to 4.0 from 3.2 last time and platelets also improved, from 128,000 to 149,000.  HISTORY:   Past Medical History:  Diagnosis Date  . A-fib (HCC)   . Anxiety   . GERD (gastroesophageal reflux disease)   . Thyroid  disease   . Urticaria   Deep venous thrombosis in October 2012 of the left lower extremity (she was on oral contraceptives at that time)   Past Surgical History:  Procedure Laterality Date  . CATARACT EXTRACTION Bilateral    March/April 2024  . TUMOR REMOVAL     behind ear    Family History  Problem Relation Age of Onset  . Allergic rhinitis Neg Hx   . Angioedema Neg Hx   . Asthma Neg Hx   . Atopy Neg Hx   . Eczema Neg Hx   . Immunodeficiency Neg Hx   . Urticaria Neg Hx   Her maternal aunt had colon cancer at age 30 A maternal great grandmother had breast cancer.  Social History:  reports that she has quit smoking. Her smoking use included cigarettes. She has never used smokeless tobacco. She reports that she does not currently use alcohol. She reports that she does not use drugs.The patient is alone today.  She is married and has 2 stepsons.  Allergies:  Allergies  Allergen Reactions  . Nuprin [Ibuprofen] Other (See Comments)    FOREHEAD BREAKS OUT    Current Medications: Current Outpatient Medications  Medication Sig Dispense Refill  . ALPRAZolam (XANAX) 0.25 MG tablet Take 0.25 mg by mouth as  needed.    SABRA CALCIUM PO Take 500 mg by mouth as needed.    . Cholecalciferol (D3 ADULT PO) Take by mouth.    . cholecalciferol (VITAMIN D) 1000 UNITS tablet Take 1,000 Units by mouth daily.    SABRA diltiazem (CARDIZEM CD) 120 MG 24 hr capsule Take 120 mg by mouth daily.    . dupilumab  (DUPIXENT ) 300 MG/2ML prefilled syringe Inject 300 mg into the skin every 28 (twenty-eight) days. 4 mL 11  . ELIQUIS 5 MG TABS tablet Take 5 mg by mouth 2 (two) times daily.    . famotidine (PEPCID) 20 MG tablet Take 20 mg by mouth at bedtime. (Patient not taking: Reported on 06/08/2023)    . levothyroxine (SYNTHROID) 25 MCG tablet Take 25 mcg by mouth daily.    . Lutein 10 MG TABS Take 10 mg by mouth daily.    SABRA MAGNESIUM CARBONATE PO Take 1  Dose by mouth daily.    SABRA omeprazole (PRILOSEC) 40 MG capsule Take 40 mg by mouth daily. (Patient not taking: Reported on 06/08/2023)    . polyethylene glycol (MIRALAX / GLYCOLAX) 17 g packet Take 17 g by mouth daily.    . vitamin C (ASCORBIC ACID) 500 MG tablet Take 500 mg by mouth daily.    . Wheat Dextrin (BENEFIBER PO) Take by mouth. 1 tsp nightly     Current Facility-Administered Medications  Medication Dose Route Frequency Provider Last Rate Last Admin  . dupilumab  (DUPIXENT ) prefilled syringe 300 mg  300 mg Subcutaneous Q14 Days Kozlow, Eric J, MD   300 mg at 07/06/23 1448    REVIEW OF SYSTEMS:  Review of Systems  Constitutional: Negative.  Negative for appetite change, chills, diaphoresis, fatigue, fever and unexpected weight change.  HENT:  Negative.  Negative for hearing loss, lump/mass, mouth sores, nosebleeds, sore throat, tinnitus, trouble swallowing and voice change.        She was seen by an ENT doctor in [facility] for hoarseness.  Eyes: Negative.  Negative for eye problems and icterus.  Respiratory: Negative.  Negative for chest tightness, cough, hemoptysis, shortness of breath and wheezing.   Cardiovascular: Negative.  Negative for chest pain, leg swelling  and palpitations.  Gastrointestinal: Negative.  Negative for abdominal distention, abdominal pain, blood in stool, constipation, diarrhea, nausea, rectal pain and vomiting.  Endocrine: Positive for hot flashes.  Genitourinary: Negative.  Negative for bladder incontinence, difficulty urinating, dyspareunia, dysuria, frequency, hematuria, menstrual problem, nocturia, pelvic pain, vaginal bleeding and vaginal discharge.   Musculoskeletal: Negative.  Negative for arthralgias, back pain, flank pain, gait problem, myalgias, neck pain and neck stiffness.  Skin:  Positive for itching. Negative for rash and wound.       Urticaria, being treated by Dr. Kozlow with Xolair   Neurological: Negative.  Negative for dizziness, extremity weakness, gait problem, headaches, light-headedness, numbness, seizures and speech difficulty.  Hematological: Negative.  Negative for adenopathy. Does not bruise/bleed easily.  Psychiatric/Behavioral:  Negative for confusion, decreased concentration, depression, sleep disturbance and suicidal ideas. The patient is nervous/anxious.     VITALS:  There were no vitals taken for this visit.  Wt Readings from Last 3 Encounters:  03/23/23 112 lb 8 oz (51 kg)  09/15/22 119 lb 4.8 oz (54.1 kg)  07/16/22 118 lb 1.6 oz (53.6 kg)    There is no height or weight on file to calculate BMI.  Performance status (ECOG): 0 - Asymptomatic  PHYSICAL EXAM:  Physical Exam Vitals and nursing note reviewed. Exam conducted with a chaperone present.  Constitutional:      General: She is not in acute distress.    Appearance: Normal appearance. She is normal weight. She is not ill-appearing, toxic-appearing or diaphoretic.  HENT:     Head: Normocephalic and atraumatic.     Right Ear: Tympanic membrane, ear canal and external ear normal. There is no impacted cerumen.     Left Ear: Tympanic membrane, ear canal and external ear normal. There is no impacted cerumen.     Nose: Nose normal. No  congestion or rhinorrhea.     Mouth/Throat:     Mouth: Mucous membranes are moist.     Pharynx: Oropharynx is clear. No oropharyngeal exudate or posterior oropharyngeal erythema.  Eyes:     General: No scleral icterus.       Right eye: No discharge.        Left eye: No discharge.  Extraocular Movements: Extraocular movements intact.     Conjunctiva/sclera: Conjunctivae normal.     Pupils: Pupils are equal, round, and reactive to light.  Neck:     Vascular: No carotid bruit.  Cardiovascular:     Rate and Rhythm: Normal rate and regular rhythm.     Pulses: Normal pulses.     Heart sounds: Normal heart sounds. No murmur heard.    No friction rub. No gallop.  Pulmonary:     Effort: Pulmonary effort is normal. No respiratory distress.     Breath sounds: Normal breath sounds. No stridor. No wheezing, rhonchi or rales.  Chest:     Chest wall: No tenderness.  Abdominal:     General: Bowel sounds are normal. There is no distension.     Palpations: Abdomen is soft. There is no hepatomegaly, splenomegaly or mass.     Tenderness: There is no abdominal tenderness. There is no right CVA tenderness, left CVA tenderness, guarding or rebound.     Hernia: No hernia is present.  Musculoskeletal:        General: No swelling, tenderness, deformity or signs of injury. Normal range of motion.     Cervical back: Normal range of motion and neck supple. No rigidity or tenderness.     Right lower leg: No edema.     Left lower leg: No edema.  Lymphadenopathy:     Cervical: No cervical adenopathy.     Right cervical: No superficial, deep or posterior cervical adenopathy.    Left cervical: No superficial, deep or posterior cervical adenopathy.     Upper Body:     Right upper body: No supraclavicular, axillary or pectoral adenopathy.     Left upper body: No supraclavicular, axillary or pectoral adenopathy.  Skin:    General: Skin is warm and dry.     Coloration: Skin is not jaundiced or pale.      Findings: No bruising, erythema, lesion or rash.  Neurological:     General: No focal deficit present.     Mental Status: She is alert and oriented to person, place, and time. Mental status is at baseline.     Cranial Nerves: No cranial nerve deficit.     Sensory: No sensory deficit.     Motor: No weakness.     Coordination: Coordination normal.     Gait: Gait normal.     Deep Tendon Reflexes: Reflexes normal.  Psychiatric:        Mood and Affect: Mood normal.        Behavior: Behavior normal.        Thought Content: Thought content normal.        Judgment: Judgment normal.   LABS:   Component Ref Range & Units 06/21/2023  WBC 4.40 - 11.00 10*3/uL 3.30 Low   RBC 4.10 - 5.10 10*6/uL 4.29  Hemoglobin 12.3 - 15.3 g/dL 86.8  Hematocrit 64.0 - 44.6 % 38.8  Mean Corpuscular Volume (MCV) 80.0 - 96.0 fL 90.5  Mean Corpuscular Hemoglobin (MCH) 27.5 - 33.2 pg 30.6  Mean Corpuscular Hemoglobin Conc (MCHC) 33.0 - 37.0 g/dL 66.0  Red Cell Distribution Width (RDW) 12.3 - 17.0 % 14.6  Platelet Count (PLT) 150 - 450 10*3/uL 150   Component Ref Range & Units 06/21/2023  Sodium 136 - 145 mmol/L 141  Potassium 3.5 - 5.1 mmol/L 6.1 High Panic   Chloride 98 - 107 mmol/L 103  CO2 21 - 31 mmol/L 33 High   Anion Gap 6 - 14  mmol/L 5 Low   Glucose, Random 70 - 99 mg/dL 81  Blood Urea Nitrogen (BUN) 7 - 25 mg/dL 22  Creatinine 9.39 - 8.79 mg/dL 9.09  eGFR >40 fO/fpw/8.26f7 71  Albumin 3.5 - 5.7 g/dL 4.2  Total Protein 6.4 - 8.9 g/dL 6.6  Bilirubin, Total 0.3 - 1.0 mg/dL 0.5  Alkaline Phosphatase (ALP) 34 - 104 U/L 90  Aspartate Aminotransferase (AST) 13 - 39 U/L 27  Alanine Aminotransferase (ALT) 7 - 52 U/L 26  Calcium 8.6 - 10.3 mg/dL 9.6   Component Ref Range & Units 06/22/2023  Sodium 136 - 145 mmol/L 138  Potassium 3.5 - 5.1 mmol/L 4.2  Chloride 98 - 107 mmol/L 101  CO2 21 - 31 mmol/L 32 High   Anion Gap 6 - 14 mmol/L 5 Low   Glucose, Random 70 - 99  mg/dL 77  Blood Urea Nitrogen (BUN) 7 - 25 mg/dL 19  Creatinine 9.39 - 8.79 mg/dL 9.20  eGFR >40 fO/fpw/8.26f7 82  Calcium 8.6 - 10.3 mg/dL 9.2   Component Ref Range & Units 06/22/2023  TSH 0.450 - 5.330 uIU/mL 1.692   Component Ref Range & Units 06/21/2023  Cholesterol, Total, Lipid Panel <200 mg/dL 809  Triglycerides, Lipid Panel <150 mg/dL 37  HDL Cholesterol - Lipid Panel >=60 mg/dL 78  LDL Cholesterol, Calculated <100 mg/dL 898 High   Non-HDL Cholesterol mg/dL 887      Latest Ref Rng & Units 03/23/2023    2:26 PM 09/15/2022   12:00 AM 07/16/2022   12:00 AM  CBC  WBC 4.0 - 10.5 K/uL 4.0  3.2     3.9      Hemoglobin 12.0 - 15.0 g/dL 87.5  87.5     87.5      Hematocrit 36.0 - 46.0 % 38.4  37     37      Platelets 150 - 400 K/uL 149  128     139         This result is from an external source.      Latest Ref Rng & Units 07/16/2022   12:00 AM  CMP  BUN 4 - 21 20      Creatinine 0.5 - 1.1 0.8      Sodium 137 - 147 140      Potassium 3.5 - 5.1 mEq/L 4.3      Chloride 99 - 108 102      CO2 13 - 22 35      Calcium 8.7 - 10.7 9.6      Alkaline Phos 25 - 125 94      AST 13 - 35 39      ALT 7 - 35 U/L 36         This result is from an external source.   No results found for: CEA1, CEA / No results found for: CEA1, CEA No results found for: PSA1 No results found for: CAN199 No results found for: RJW874  Lab Results  Component Value Date   TOTALPROTELP 6.3 07/16/2022   ALBUMINELP 3.7 07/16/2022   A1GS 0.2 07/16/2022   A2GS 0.6 07/16/2022   BETS 0.8 07/16/2022   GAMS 1.0 07/16/2022   MSPIKE Not Observed 07/16/2022   SPEI Comment 07/16/2022   No results found for: TIBC, FERRITIN, IRONPCTSAT No results found for: LDH  STUDIES:  No results found.     I,Jasmine M Lassiter,acting as a scribe for Heidi VEAR Cornish, MD.,have documented all relevant documentation on the behalf  of Heidi VEAR Cornish, MD,as directed by  Heidi VEAR Cornish, MD while in the presence of Heidi VEAR Cornish, MD.

## 2024-05-10 ENCOUNTER — Encounter: Payer: Self-pay | Admitting: Allergy and Immunology

## 2024-05-10 ENCOUNTER — Ambulatory Visit: Admitting: Allergy and Immunology

## 2024-05-10 VITALS — BP 132/62 | HR 60 | Resp 12 | Ht 62.7 in | Wt 114.6 lb

## 2024-05-10 DIAGNOSIS — L2089 Other atopic dermatitis: Secondary | ICD-10-CM | POA: Diagnosis not present

## 2024-05-10 NOTE — Patient Instructions (Addendum)
  1. Can resubmit for Dupilumab  injections   2. Start Vtama - apply 1 time per day  3. If needed:  A. Derma-Smoothe  scalp oil one time per day   4. Contact clinic with progress report in 4 weeks

## 2024-05-10 NOTE — Progress Notes (Signed)
 [place] [facility] [place] [facility] [place] [facility] [place] [facility] [place]   Follow-up Note  Referring Provider: Thurmond Cathlyn LABOR., MD Primary Provider: Thurmond Cathlyn LABOR., MD Date of Office Visit: 05/10/2024  Subjective:   Heidi Raymond (DOB: [birth year]) is a 68 y.o. female who [facility] in re-evaluation of the following:  HPI: Heidi Raymond returns to this clinic in evaluation of her urticaria and atopic dermatitis.  I last saw in this clinic 08 June 2023.  During her last visit we changed her from omalizumab  to dupilumab  and that has resulted in complete control of all of her skin issues and she no longer had any erythematous patches.  Unfortunately, because of an insurance issues she was not able to get dupilumab  in 2025.  She did okay for a few months but over the course of the past 3 months she has had progressive problems with patches of red irritated itchy skin affecting her back that has not responding to topical steroids.  She had a few samples of Opzelura which helps a little bit does not really get rid of this issue.  Allergies as of 05/10/2024       Reactions   Nuprin [ibuprofen] Other (See Comments)   FOREHEAD BREAKS OUT        Medication List    ALPRAZolam 0.25 MG tablet Commonly known as: XANAX Take 0.25 mg by mouth as needed.   ascorbic acid 500 MG tablet Commonly known as: VITAMIN C Take 500 mg by mouth daily.   BENEFIBER PO Take by mouth. 1 tsp nightly   D3 ADULT PO Take by mouth.   diltiazem 120 MG 24 hr capsule Commonly known as: CARDIZEM CD Take 120 mg by mouth daily.   Eliquis 5 MG Tabs tablet Generic drug: apixaban Take 5 mg by mouth 2 (two) times daily.   famotidine 20 MG tablet Commonly known as: PEPCID Take 20 mg by mouth at bedtime.   levothyroxine 25 MCG tablet Commonly known as: SYNTHROID Take 25 mcg by mouth daily.   Lutein 10 MG Tabs Take 10 mg by mouth daily.   MAGNESIUM OXIDE PO Take by mouth  daily.   polyethylene glycol 17 g packet Commonly known as: MIRALAX / GLYCOLAX Take 17 g by mouth daily.   TRIPLE OMEGA COMPLEX PO Take by mouth daily.   VITAMIN B-12 PO Take by mouth daily.    Past Medical History:  Diagnosis Date   A-fib (HCC)    Anxiety    GERD (gastroesophageal reflux disease)    Thyroid  disease    Urticaria     Past Surgical History:  Procedure Laterality Date   CATARACT EXTRACTION Bilateral    March/April 2024   TUMOR REMOVAL     behind ear    Review of systems negative except as noted in HPI / PMHx or noted below:  Review of Systems  Constitutional: Negative.   HENT: Negative.    Eyes: Negative.   Respiratory: Negative.    Cardiovascular: Negative.   Gastrointestinal: Negative.   Genitourinary: Negative.   Musculoskeletal: Negative.   Skin: Negative.   Neurological: Negative.   Endo/Heme/Allergies: Negative.   Psychiatric/Behavioral: Negative.       Objective:   Vitals:   05/10/24 1634  BP: 132/62  Pulse: 60  Resp: 12  SpO2: 96%   Height: 5' 2.7 (159.3 cm)  Weight: 114 lb 9.6 oz (52 kg)   Physical Exam  Skin:    Findings: Rash (Large  indurated erythematous scaly patches back) present.     Diagnostics: none  Assessment and Plan:   1. Other atopic dermatitis    1. Can resubmit for Dupilumab  injections   2. Start Vtama - apply 1 time per day  3. If needed:  A. Derma-Smoothe  scalp oil one time per day   4. Contact clinic with progress report in 4 weeks  Heidi Raymond will start Vtama and she will let me know how this works and if it does not work then I am encouraged her to resubmit for dupilumab  injections.  She may require a systemic steroid injection as well just to jumpstart this anti-inflammatory effect.  Camellia Denis, MD Allergy / Immunology [facility]

## 2024-05-14 ENCOUNTER — Encounter: Payer: Self-pay | Admitting: Allergy and Immunology

## 2024-12-21 ENCOUNTER — Encounter: Payer: Self-pay | Admitting: Internal Medicine

## 2024-12-21 ENCOUNTER — Ambulatory Visit: Admitting: Internal Medicine

## 2024-12-21 ENCOUNTER — Other Ambulatory Visit

## 2024-12-21 VITALS — BP 118/64 | HR 66 | Ht 63.0 in | Wt 118.2 lb

## 2024-12-21 DIAGNOSIS — M810 Age-related osteoporosis without current pathological fracture: Secondary | ICD-10-CM | POA: Insufficient documentation

## 2024-12-21 LAB — VITAMIN D 25 HYDROXY (VIT D DEFICIENCY, FRACTURES): Vit D, 25-Hydroxy: 65 ng/mL (ref 30–100)

## 2024-12-21 NOTE — Progress Notes (Signed)
 Patient ID: Heidi Raymond, female   DOB: 1956/07/13, 69 y.o.   MRN: [id number]  HPI  Heidi Raymond is a 69 y.o.-year-old female, referred by her OB/GYN provider, Dr. Rox, for management of osteoporosis (OP).  She is here with her husband who offers part of the history.  Pt was dx with OP in 2018.  I reviewed pt's DXA scans: Date L1-L4 T score FN T score 33% distal Radius  10/03/2024 ([facility]) -3.6 RFN: -2.7 LFN: -2.6 N/a  [facility]02/[facility]) -3.1 RFN: -2.1 LFN: -2.1 N/a   She denies fractures.  No falls  No dizziness/vertigo/orthostasis/poor vision.   Previous OP treatments:  - she was offered Fosamax in 2018 >> declined   No h/o vitamin D deficiency. Reviewed available vit D levels: No results found for: VD25OH  Pt is on: - vitamin D3 1000 units daily  + weight bearing exercises - hand weights 3x a week.   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  She does not take high vitamin A doses.  No steroid inj's.  She only had occasional Prednisone in the past.  Menopause was at 69 y/o.   FH of osteoporosis: hip fracture [facility] - 69 y/o, arm and knee fracture in [facility].  No h/o hyper/hypocalcemia or hyperparathyroidism. No h/o kidney stones. 09/24/2024: Calcium 9.5 Lab Results  Component Value Date   CALCIUM 9.6 07/16/2022   No  thyrotoxicosis.  She has a history of hypothyroidism, on levothyroxine 25 mcg daily.    No results found for: TSH  No h/o CKD. 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  She was on Xolair  injections for hives for 7 years.  She had a DVT L leg 2012.   ROS: Constitutional: no weight gain, no weight loss, no fatigue, no subjective hyperthermia, no subjective hypothermia, no nocturia Eyes: no blurry  vision, no xerophthalmia ENT: no sore throat, no nodules palpated in neck, no dysphagia, no odynophagia, no hoarseness, no tinnitus, no hypoacusis Cardiovascular: no CP, no SOB, no palpitations, no leg swelling Respiratory: no cough, no SOB, no wheezing Gastrointestinal: no N, no V, no D, no C, no acid reflux Musculoskeletal: no muscle, no joint aches Skin: + Rash-eczema, no hair loss Neurological: + tremor in the right hand only when writing, no numbness or tingling/no dizziness/no HAs Psychiatric: no depression, + problem 1 anxiety  I reviewed pt's medications, allergies, PMH, social hx, family hx, and changes were documented in the history of present illness. Otherwise, unchanged from my initial visit note.  Past Medical History:  Diagnosis Date   A-fib [facility])    Anxiety    GERD (gastroesophageal reflux disease)    Thyroid  disease    Urticaria    Past Surgical History:  Procedure Laterality Date   CATARACT EXTRACTION Bilateral    March/April 2024   TUMOR REMOVAL     behind ear   Social History   Socioeconomic History   Marital status: Married    Spouse name: Koren   Number of children: 2   Years of education: 12   Highest education level: 12th grade  Occupational History   Occupation: retired  Tobacco Use   Smoking status: Former    Types: Cigarettes   Smokeless tobacco: Never   Tobacco comments:  Smoked socially in her early 20's  Vaping Use   Vaping status: Never Used  Substance and Sexual Activity   Alcohol use: Not Currently    Comment: drinks socially in younger years   Drug use: Never   Sexual activity: Not Currently  Other Topics Concern   Not on file  Social History Narrative   She has 2 adopted children   Social Drivers of Health   Tobacco Use: Medium Risk (12/21/2024)   Patient History    Smoking Tobacco Use: Former    Smokeless Tobacco Use: Never    Passive Exposure: Not on Actuary Strain: Not on file  Food Insecurity: Low  Risk (09/29/2024)   Received from [facility]   Epic    Within the past 12 months, you worried that your food would run out before you got money to buy more: Never true    Within the past 12 months, the food you bought just didn't last and you didn't have money to get more. : Never true  Transportation Needs: No Transportation Needs (09/29/2024)   Received from [facility]    In the past 12 months, has lack of reliable transportation kept you from medical appointments, meetings, work or from getting things needed for daily living? : No  Physical Activity: Not on file  Stress: Not on file  Social Connections: Not on file  Intimate Partner Violence: Not on file  Depression (EYV7-0): Not on file  Alcohol Screen: Not on file  Housing: Low Risk (09/29/2024)   Received from [facility]   Epic    What is your living situation today?: I have a steady place to live    Think about the place you live. Do you have problems with any of the following? Choose all that apply:: None/None on this list  Utilities: Low Risk (09/29/2024)   Received from [facility]   Utilities    In the past 12 months has the electric, gas, oil, or water  company threatened to shut off services in your home? : No  Health Literacy: Not on file   Medications Ordered Prior to Encounter[1] Allergies[2] Family History  Problem Relation Age of Onset   Thyroid  disease Mother    Heart disease Mother    Hypertension Father    Diabetes Father    Allergic rhinitis Neg Hx    Angioedema Neg Hx    Asthma Neg Hx    Atopy Neg Hx    Eczema Neg Hx    Immunodeficiency Neg Hx    Urticaria Neg Hx    PE: BP 118/64   Pulse 66   Ht 5' 3 (1.6 m)   Wt 118 lb 3.2 oz (53.6 kg)   SpO2 95%   BMI 20.94 kg/m physical Wt Readings from Last 3 Encounters:  12/21/24 118 lb 3.2 oz (53.6 kg)  05/10/24 114 lb 9.6 oz (52 kg)  11/22/23 113 lb 14.4 oz (51.7 kg)   Constitutional: Normal weight, in NAD. No  kyphosis Eyes:  EOMI, no exophthalmos ENT: no neck masses, no cervical lymphadenopathy Cardiovascular: RRR, No MRG Respiratory: CTA B Musculoskeletal: no deformities Skin:no rashes Neurological: no tremor with outstretched hands  Assessment: 1. Osteoporosis  Plan: 1. Osteoporosis - likely postmenopausal/age-related, she has FH of OP - Discussed about increased risk of fracture, depending on the T score, greatly increased when the T score is lower than -2.5, but it is actually a continuum and -2.5 should not be regarded as  an absolute threshold. We reviewed her 2 DXA scan reports together, and I explained that based on the T scores, she has an increased risk for fractures.  - we reviewed her dietary and supplemental calcium and vitamin D intake. I recommended to make sure she gets 1000-1200 mg of calcium daily preferentially from diet.  I advised her that if she had to supplement, to take calcium citrate, rather than carbonate, since she is on a PPI, which can decrease the calcium absorption in the carbonate form.  She is taking 1000 units of vitamin D daily and and I will check vit D today to see if she needs further supplementation. - discussed fall precautions, and also given written instructions - given handout from [facility] Re: weight bearing exercises - advised to do this every day or at least 5/7 days.  As of now, she is using hand weights approximately 3 times a week and we discussed about increasing the frequency. - we discussed about maintaining a good amount of protein in her diet. The recommended daily protein intake is ~0.8 g per kilogram per day (I recommended approximately 50 g a day for her). I advised her to try to aim for this amount, since a diet low in proteins can exacerbate osteoporosis. Also, avoid smoking or >2 drinks of alcohol a day -she is not smoking or drinking. - I recommended the following book for the concept of low acid eating:  - We  discussed about the different medication classes, benefits (reduction in fraction rate by 50% or more) and side effects (including atypical fractures and ONJ -  incidence of 1 in 10,000-40,000 pts. - I explained that first options are sq denosumab (Prolia) sq every 6 months for 6-10 years and zoledronic acid (Reclast) iv 1x a year for 1-2 years. I would use Teriparatide/Abaloparatide (which are daily subcu medication) or Romosozumab (monthly) as a last resort.  I would not recommend oral medications for her due to GERD and taking acid reflux medicines, which would decrease the absorption of the oral formulation - Pt was given reading information about Prolia, and I explained the mechanism of action and expected benefits.  She agrees with Prolia.  We usually have patients going to the [facility].  However, she would prefer to obtain this closer to her home in [place].  She would like to check with PCP if she can get it in his office, but if not, I advised her to let me know so I can order it here - Will check the following labs today: Orders Placed This Encounter  Procedures   VITAMIN D 25 Hydroxy (Vit-D Deficiency, Fractures)  - will check a new DXA scan in 2 years after starting Prolia - the first indication that the treatment is working is her not having fractures. DXA scan changes are secondary: unchanged or slightly higher T-scores are desirable. - will see pt back in a year  - Total time spent for the visit today: 1 hour, in precharting, post charting, obtaining medical information from the chart and from the patient and her husband in, reviewing previous labs, imaging evaluations, and treatments, reviewing her symptoms, counseling her about her conditions (please see the discussed topics above), and developing a plan to further investigate and treat them; she had a number of questions which I addressed.    Lela Fendt, MD PhD [facility]      [1]  Current  Outpatient Medications on File Prior to Visit  Medication  Sig Dispense Refill   ALPRAZolam (XANAX) 0.25 MG tablet Take 0.25 mg by mouth as needed.     Cholecalciferol (D3 ADULT PO) Take by mouth.     Cyanocobalamin  (VITAMIN B-12 PO) Take by mouth daily.     diltiazem (CARDIZEM CD) 120 MG 24 hr capsule Take 120 mg by mouth daily.     ELIQUIS 5 MG TABS tablet Take 5 mg by mouth 2 (two) times daily.     famotidine (PEPCID) 20 MG tablet Take 20 mg by mouth at bedtime. (Patient not taking: Reported on 05/10/2024)     levothyroxine (SYNTHROID) 25 MCG tablet Take 25 mcg by mouth daily.     Lutein 10 MG TABS Take 10 mg by mouth daily.     MAGNESIUM OXIDE PO Take by mouth daily.     Omega 3-6-9 Fatty Acids (TRIPLE OMEGA COMPLEX PO) Take by mouth daily.     polyethylene glycol (MIRALAX / GLYCOLAX) 17 g packet Take 17 g by mouth daily.     vitamin C (ASCORBIC ACID) 500 MG tablet Take 500 mg by mouth daily.     Wheat Dextrin (BENEFIBER PO) Take by mouth. 1 tsp nightly     Current Facility-Administered Medications on File Prior to Visit  Medication Dose Route Frequency Provider Last Rate Last Admin   dupilumab  (DUPIXENT ) prefilled syringe 300 mg  300 mg Subcutaneous Q14 Days Kozlow, Eric J, MD   300 mg at 07/06/23 1448  [2]  Allergies Allergen Reactions   Nuprin [Ibuprofen] Other (See Comments)    FOREHEAD BREAKS OUT

## 2024-12-21 NOTE — Patient Instructions (Addendum)
 Let's start Prolia - let me know if I need to order it at the [facility]..  Make sure you are getting 1000-1200 mg calcium a day, ideally from the diet, but may need to supplement - Calcium citrate rather than calcium carbonate.  Try to get ~50g of proteins a day.  How Can I Prevent Falls? Men and women with osteoporosis need to take care not to fall down. Falls can break bones. Some reasons people fall are: Poor vision  Poor balance  Certain diseases that affect how you walk  Some types of medicine, such as sleeping pills.  Some tips to help prevent falls outdoors are: Use a cane or walker  Wear rubber-soled shoes so you don't slip  Walk on grass when sidewalks are slippery  In winter, put salt or kitty litter on icy sidewalks.  Some ways to help prevent falls indoors are: Keep rooms free of clutter, especially on floors  Use plastic or carpet runners on slippery floors  Wear low-heeled shoes that provide good support  Do not walk in socks, stockings, or slippers  Be sure carpets and area rugs have skid-proof backs or are tacked to the floor  Be sure stairs are well lit and have rails on both sides  Put grab bars on bathroom walls near tub, shower, and toilet  Use a rubber bath mat in the shower or tub  Keep a flashlight next to your bed  Use a sturdy step stool with a handrail and wide steps  Add more lights in rooms (and night lights) Buy a cordless phone to keep with you so that you don't have to rush to the phone       when it rings and so that you can call for help if you fall.   (adapted from http://www.niams.Hostesstraining.at)  Please check out the following book about best diet for bone health:   Exercise for Strong Bones (from [facility]) There are two types of exercises that are important for building and maintaining bone density:  weight-bearing and muscle-strengthening  exercises. Weight-bearing Exercises These exercises include activities that make you move against gravity while staying upright. Weight-bearing exercises can be high-impact or low-impact. High-impact weight-bearing exercises help build bones and keep them strong. If you have broken a bone due to osteoporosis or are at risk of breaking a bone, you may need to avoid high-impact exercises. If youre not sure, you should check with your healthcare provider. Examples of high-impact weight-bearing exercises are: Dancing Doing high-impact aerobics Hiking Jogging/running Jumping Rope Stair climbing Tennis Low-impact weight-bearing exercises can also help keep bones strong and are a safe alternative if you cannot do high-impact exercises. Examples of low-impact weight-bearing exercises are: Using elliptical training machines Doing low-impact aerobics Using stair-step machines Fast walking on a treadmill or outside Muscle-Strengthening Exercises These exercises include activities where you move your body, a weight or some other resistance against gravity. They are also known as resistance exercises and include: Lifting weights Using elastic exercise bands Using weight machines Lifting your own body weight Functional movements, such as standing and rising up on your toes Yoga and Pilates can also improve strength, balance and flexibility. However, certain positions may not be safe for people with osteoporosis or those at increased risk of broken bones. For example, exercises that have you bend forward may increase the chance of breaking a bone in the spine. A physical therapist should be able to help you learn which exercises are safe and  appropriate for you. Non-Impact Exercises Non-impact exercises can help you to improve balance, posture and how well you move in everyday activities. These exercises can also help to increase muscle strength and decrease the risk of falls and broken bones. Some of  these exercises include: Balance exercises that strengthen your legs and test your balance, such as Tai Chi, can decrease your risk of falls. Posture exercises that improve your posture and reduce rounded or sloping shoulders can help you decrease the chance of breaking a bone, especially in the spine. Functional exercises that improve how well you move can help you with everyday activities and decrease your chance of falling and breaking a bone. For example, if you have trouble getting up from a chair or climbing stairs, you should do these activities as exercises. A physical therapist can teach you balance, posture and functional exercises. Starting a New Exercise Program If you havent exercised regularly for a while, check with your healthcare provider before beginning a new exercise program--particularly if you have health problems such as heart disease, diabetes or high blood pressure. If youre at high risk of breaking a bone, you should work with a physical therapist to develop a safe exercise program. Once you have your healthcare providers approval, start slowly. If youve already broken bones in the spine because of osteoporosis, be very careful to avoid activities that require reaching down, bending forward, rapid twisting motions, heavy lifting and those that increase your chance of a fall. As you get started, your muscles may feel sore for a day or two after you exercise. If soreness lasts longer, you may be working too hard and need to ease up. Exercises should be done in a pain-free range of motion. How Much Exercise Do You Need? Weight-bearing exercises 30 minutes on most days of the week. Do a 30-minutesession or multiple sessions spread out throughout the day. The benefits to your bones are the same.   Muscle-strengthening exercises Two to three days per week. If you dont have much time for strengthening/resistance training, do small amounts at a time. You can do just one body part  each day. For example do arms one day, legs the next and trunk the next. You can also spread these exercises out during your normal day.  Balance, posture and functional exercises Every day or as often as needed. You may want to focus on one area more than the others. If you have fallen or lose your balance, spend time doing balance exercises. If you are getting rounded shoulders, work more on posture exercises. If you have trouble climbing stairs or getting up from the couch, do more functional exercises. You can also perform these exercises at one time or spread them during your day. Work with a phyiscal therapist to learn the right exercises for you.   Denosumab: Patient drug information (Up-to-date) Copyright [phone] [employer] All rights reserved.  Brand Names: [place]  Prolia;  Xgeva What is this drug used for?  It is used to treat soft, brittle bones (osteoporosis).  It is used for bone growth.  It is used when treating some cancers.  It may be given to you for other reasons. Talk with the doctor. What do I need to tell my doctor BEFORE I take this drug?  All products:  If you have an allergy to denosumab or any other part of this drug.  If you are allergic to any drugs like this one, any other drugs, foods, or other substances. Tell your  doctor about the allergy and what signs you had, like rash; hives; itching; shortness of breath; wheezing; cough; swelling of face, lips, tongue, or throat; or any other signs.  If you have low calcium levels.  Prolia:  If you are pregnant or may be pregnant. Do not take this drug if you are pregnant.  This is not a list of all drugs or health problems that interact with this drug.  Tell your doctor and pharmacist about all of your drugs (prescription or OTC, natural products, vitamins) and health problems. You must check to make sure that it is safe for you to take this drug with all of your drugs and health problems. Do not start, stop, or change  the dose of any drug without checking with your doctor. What are some things I need to know or do while I take this drug?  All products:  Tell dentists, surgeons, and other doctors that you use this drug.  This drug may raise the chance of a broken leg. Talk with your doctor.  Have your blood work checked. Talk with your doctor.  Have a bone density test. Talk with your doctor.  Take calcium and vitamin D as you were told by your doctor.  Have a dental exam before starting this drug.  Take good care of your teeth. See a dentist often.  If you smoke, talk with your doctor.  Do not give to a child. Talk with your doctor.  Tell your doctor if you are breast-feeding. You will need to talk about any risks to your baby.  Xgeva:  This drug may cause harm to the unborn baby if you take it while you are pregnant. If you get pregnant while taking this drug, call your doctor right away.  Prolia:  Very bad infections have been reported with use of this drug. If you have any infection, are taking antibiotics now or in the recent past, or have many infections, talk with your doctor.  You may have more chance of getting an infection. Wash hands often. Stay away from people with infections, colds, or flu.  Use birth control that you can trust to prevent pregnancy while taking this drug.  If you are a man and your sex partner is pregnant or gets pregnant at any time while you are being treated, talk with your doctor. What are some side effects that I need to call my doctor about right away?  WARNING/CAUTION: Even though it may be rare, some people may have very bad and sometimes deadly side effects when taking a drug. Tell your doctor or get medical help right away if you have any of the following signs or symptoms that may be related to a very bad side effect:  All products:  Signs of an allergic reaction, like rash; hives; itching; red, swollen, blistered, or peeling skin with or without fever; wheezing;  tightness in the chest or throat; trouble breathing or talking; unusual hoarseness; or swelling of the mouth, face, lips, tongue, or throat.  Signs of low calcium levels like muscle cramps or spasms, numbness and tingling, or seizures.  Mouth sores.  Any new or strange groin, hip, or thigh pain.  This drug may cause jawbone problems. The chance may be higher the longer you take this drug. The chance may be higher if you have cancer, dental problems, dentures that do not fit well, anemia, blood clotting problems, or an infection. The chance may also be higher if you are having dental work  or if you are getting chemo, some steroid drugs, or radiation. Call your doctor right away if you have jaw swelling or pain.  Xgeva:  Not hungry.  Muscle pain or weakness.  Seizures.  Shortness of breath.  Prolia:  Signs of infection. These include a fever of 100.66F (38C) or higher, chills, very bad sore throat, ear or sinus pain, cough, more sputum or change in color of sputum, pain with passing urine, mouth sores, wound that will not heal, or anal itching or pain.  Signs of a pancreas problem (pancreatitis) like very bad stomach pain, very bad back pain, or very bad upset stomach or throwing up.  Chest pain.  A heartbeat that does not feel normal.  Very bad skin irritation.  Feeling very tired or weak.  Bladder pain or pain when passing urine or change in how much urine is passed.  Passing urine often.  Swelling in the arms or legs. What are some other side effects of this drug?  All drugs may cause side effects. However, many people have no side effects or only have minor side effects. Call your doctor or get medical help if any of these side effects or any other side effects bother you or do not go away:  Xgeva:  Feeling tired or weak.  Headache.  Upset stomach or throwing up.  Loose stools (diarrhea).  Cough.  Prolia:  Back pain.  Muscle or joint pain.  Sore throat.  Runny nose.  Pain in  arms or legs.  These are not all of the side effects that may occur. If you have questions about side effects, call your doctor. Call your doctor for medical advice about side effects.  You may report side effects to your national health agency. How is this drug best taken?  Use this drug as ordered by your doctor. Read and follow the dosing on the label closely.  It is given as a shot into the fatty part of the skin. What do I do if I miss a dose?  Call the doctor to find out what to do. How do I store and/or throw out this drug?  This drug will be given to you in a hospital or doctor's office. You will not store it at home.  Keep all drugs out of the reach of children and pets.  Check with your pharmacist about how to throw out unused drugs.  General drug facts  If your symptoms or health problems do not get better or if they become worse, call your doctor.  Do not share your drugs with others and do not take anyone else's drugs.  Keep a list of all your drugs (prescription, natural products, vitamins, OTC) with you. Give this list to your doctor.  Talk with the doctor before starting any new drug, including prescription or OTC, natural products, or vitamins.  Some drugs may have another patient information leaflet. If you have any questions about this drug, please talk with your doctor, pharmacist, or other health care provider.  If you think there has been an overdose, call your poison control center or get medical care right away. Be ready to tell or show what was taken, how much, and when it happened.

## 2025-12-23 ENCOUNTER — Ambulatory Visit: Admitting: Internal Medicine

## 2025-12-27 ENCOUNTER — Ambulatory Visit: Admitting: Internal Medicine
